# Patient Record
Sex: Male | Born: 1960 | Race: Black or African American | Hispanic: No | Marital: Married | Smoking: Never smoker
Health system: Southern US, Community
[De-identification: ages and names within clinical notes are randomized; demographics above are authoritative.]

## PROBLEM LIST (undated history)

## (undated) DIAGNOSIS — I10 Essential (primary) hypertension: Secondary | ICD-10-CM

## (undated) DIAGNOSIS — G4730 Sleep apnea, unspecified: Secondary | ICD-10-CM

## (undated) DIAGNOSIS — N400 Enlarged prostate without lower urinary tract symptoms: Secondary | ICD-10-CM

## (undated) DIAGNOSIS — M1990 Unspecified osteoarthritis, unspecified site: Secondary | ICD-10-CM

## (undated) HISTORY — PX: BACK SURGERY: SHX140

## (undated) HISTORY — PX: APPENDECTOMY: SHX54

---

## 1998-07-17 ENCOUNTER — Encounter: Admission: RE | Admit: 1998-07-17 | Discharge: 1998-07-19 | Payer: Self-pay | Admitting: *Deleted

## 2001-03-23 ENCOUNTER — Ambulatory Visit (HOSPITAL_BASED_OUTPATIENT_CLINIC_OR_DEPARTMENT_OTHER): Admission: RE | Admit: 2001-03-23 | Discharge: 2001-03-23 | Payer: Self-pay | Admitting: *Deleted

## 2002-11-22 ENCOUNTER — Ambulatory Visit (HOSPITAL_COMMUNITY): Admission: RE | Admit: 2002-11-22 | Discharge: 2002-11-22 | Payer: Self-pay | Admitting: Orthopedic Surgery

## 2002-11-22 ENCOUNTER — Encounter: Payer: Self-pay | Admitting: Orthopedic Surgery

## 2002-12-06 ENCOUNTER — Encounter (HOSPITAL_COMMUNITY): Admission: RE | Admit: 2002-12-06 | Discharge: 2003-01-05 | Payer: Self-pay | Admitting: Orthopedic Surgery

## 2006-01-21 ENCOUNTER — Encounter (HOSPITAL_COMMUNITY): Admission: RE | Admit: 2006-01-21 | Discharge: 2006-02-01 | Payer: Self-pay | Admitting: Pulmonary Disease

## 2006-02-03 ENCOUNTER — Encounter (HOSPITAL_COMMUNITY): Admission: RE | Admit: 2006-02-03 | Discharge: 2006-03-05 | Payer: Self-pay | Admitting: Pulmonary Disease

## 2006-03-09 ENCOUNTER — Encounter (HOSPITAL_COMMUNITY): Admission: RE | Admit: 2006-03-09 | Discharge: 2006-04-08 | Payer: Self-pay | Admitting: Pulmonary Disease

## 2010-02-21 ENCOUNTER — Ambulatory Visit: Admit: 2010-02-21 | Payer: Self-pay | Admitting: Urology

## 2010-02-28 ENCOUNTER — Ambulatory Visit
Admission: RE | Admit: 2010-02-28 | Discharge: 2010-02-28 | Payer: Self-pay | Source: Home / Self Care | Attending: Urology | Admitting: Urology

## 2010-03-07 ENCOUNTER — Ambulatory Visit (INDEPENDENT_AMBULATORY_CARE_PROVIDER_SITE_OTHER): Payer: BC Managed Care – PPO | Admitting: Urology

## 2010-03-07 DIAGNOSIS — N401 Enlarged prostate with lower urinary tract symptoms: Secondary | ICD-10-CM

## 2010-03-07 DIAGNOSIS — R39198 Other difficulties with micturition: Secondary | ICD-10-CM

## 2010-04-04 ENCOUNTER — Ambulatory Visit: Payer: BC Managed Care – PPO | Admitting: Urology

## 2011-01-16 ENCOUNTER — Encounter (HOSPITAL_COMMUNITY): Payer: Self-pay | Admitting: Dietician

## 2011-01-16 NOTE — Progress Notes (Signed)
Outpatient Nutrition Counseling Progress Note Date: 01/16/11 Time: 12:15 PM  Pt was a no-show for follow-up appointment scheduled for 01/16/11 at 11:00 AM. Sent letter to pt home notifying pt of no-show and requesting reschedule.  Melody Haver, RD, LDN Date: 01/16/11 Time: 12:15 PM

## 2011-02-19 ENCOUNTER — Encounter (HOSPITAL_COMMUNITY): Payer: Self-pay | Admitting: Dietician

## 2011-02-19 NOTE — Progress Notes (Signed)
Nutrition Follow-up Note  Pt presented to office as a walk-in for a weight check. Weight today was 283#. Weight from last visit on 09/15/10 was 282# (+1#, .4% weight gain x 5 months). Pt reports he has started eating fish. Noted pt was a no-show for last appointment; he reports that he will call at a later date for a follow-up appointment.   Melody Haver, RD, LDN Date: 02/19/11 Time: 2:20 PM

## 2016-01-13 ENCOUNTER — Ambulatory Visit (INDEPENDENT_AMBULATORY_CARE_PROVIDER_SITE_OTHER): Payer: Self-pay

## 2016-01-13 ENCOUNTER — Ambulatory Visit (INDEPENDENT_AMBULATORY_CARE_PROVIDER_SITE_OTHER): Payer: BC Managed Care – PPO | Admitting: Family

## 2016-01-13 DIAGNOSIS — G8929 Other chronic pain: Secondary | ICD-10-CM

## 2016-01-13 DIAGNOSIS — M25562 Pain in left knee: Secondary | ICD-10-CM | POA: Diagnosis not present

## 2016-01-13 NOTE — Progress Notes (Signed)
   Office Visit Note   Patient: Ryan Munoz           Date of Birth: 1960/02/19           MRN: [id number] Visit Date: 01/13/2016              Requested by: Kari BaarsEdward Hawkins, MD [address] [place], [place] [id number] PCP: Fredirick MaudlinHAWKINS,EDWARD L, MD   Assessment & Plan: Visit Diagnoses:  1. Chronic pain of left knee     Plan: Discussed the possibility of MRI to rule out meniscal injury. He would not like to proceed with this at this time. He will continue with home exercise program. NSAIDs and ice for pain. Will follow up in the office as needed.   Follow-Up Instructions: Return if symptoms worsen or fail to improve.   Orders:  Orders Placed This Encounter  Procedures  . XR Knee 1-2 Views Left   No orders of the defined types were placed in this encounter.     Procedures: No procedures performed   Clinical Data: No additional findings.   Subjective: Chief Complaint  Patient presents with  . Left Knee - Pain    No injury    Patient is a 55 year old gentleman seen for evaluation of left knee pain. Patient states that he has had pain for several years but that he had a flare up last week with swelling. This has since. He is having lateral side knee pain and states that it is catching. It feels like it needs to pop when this happens. Patient also complains of the pain being worse in the evening. He takes Advil occasionally which is moderately helpful.    Review of Systems  Constitutional: Negative for chills and fever.  Musculoskeletal: Positive for arthralgias. Negative for joint swelling.     Objective: Vital Signs: There were no vitals taken for this visit.  Physical Exam  Constitutional: He is oriented to person, place, and time. He appears well-developed and well-nourished.  Pulmonary/Chest: Effort normal.  Musculoskeletal:       Left knee: He exhibits no effusion.  Neurological: He is alert and oriented to person, place, and time.    Psychiatric: He has a normal mood and affect.  Nursing note reviewed.   Left Knee Exam   Tenderness  The patient is experiencing no tenderness.     Range of Motion  The patient has normal left knee ROM.  Tests  Drawer:       Anterior - negative      Varus: negative Valgus: negative  Other  Erythema: absent Swelling: mild Effusion: no effusion present      Specialty Comments:  No specialty comments available.  Imaging: No results found.   PMFS History: There are no active problems to display for this patient.  No past medical history on file.  No family history on file.  No past surgical history on file. Social History   Occupational History  . Not on file.   Social History Main Topics  . Smoking status: Not on file  . Smokeless tobacco: Not on file  . Alcohol use Not on file  . Drug use: Unknown  . Sexual activity: Not on file

## 2016-06-21 ENCOUNTER — Emergency Department (HOSPITAL_COMMUNITY)
Admission: EM | Admit: 2016-06-21 | Discharge: 2016-06-21 | Disposition: A | Discharge status: Home / Self Care | Payer: BC Managed Care – PPO | Attending: Emergency Medicine | Admitting: Emergency Medicine

## 2016-06-21 ENCOUNTER — Encounter (HOSPITAL_COMMUNITY): Payer: Self-pay | Admitting: Emergency Medicine

## 2016-06-21 DIAGNOSIS — Y92410 Unspecified street and highway as the place of occurrence of the external cause: Secondary | ICD-10-CM | POA: Diagnosis not present

## 2016-06-21 DIAGNOSIS — S3992XA Unspecified injury of lower back, initial encounter: Secondary | ICD-10-CM | POA: Diagnosis present

## 2016-06-21 DIAGNOSIS — M542 Cervicalgia: Secondary | ICD-10-CM | POA: Diagnosis not present

## 2016-06-21 DIAGNOSIS — Y999 Unspecified external cause status: Secondary | ICD-10-CM | POA: Insufficient documentation

## 2016-06-21 DIAGNOSIS — I10 Essential (primary) hypertension: Secondary | ICD-10-CM | POA: Diagnosis not present

## 2016-06-21 DIAGNOSIS — M545 Low back pain, unspecified: Secondary | ICD-10-CM

## 2016-06-21 DIAGNOSIS — Y9389 Activity, other specified: Secondary | ICD-10-CM | POA: Insufficient documentation

## 2016-06-21 HISTORY — DX: Sleep apnea, unspecified: G47.30

## 2016-06-21 HISTORY — DX: Essential (primary) hypertension: I10

## 2016-06-21 HISTORY — DX: Benign prostatic hyperplasia without lower urinary tract symptoms: N40.0

## 2016-06-21 NOTE — Discharge Instructions (Signed)
Alternate ibuprofen and Tylenol every 3 hours for the next few days for pain. Ice to areas of soreness for the next few days and then may move to heat. Do some gentle stretching in the shower to help relax muscles. Expect to be sore for the next few day and follow up with primary care physician for recheck of ongoing symptoms but return to ER for emergent changing or worsening of symptoms.   Use Moist heat therapy by taking a dish rag and soaking in water then ringing out excess water then microwave for 10-15 seconds until hot but not so hot that it would burn and heat to areas of soreness for 15 minutes, multiple times a day.
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
## 2016-07-20 ENCOUNTER — Ambulatory Visit (INDEPENDENT_AMBULATORY_CARE_PROVIDER_SITE_OTHER): Payer: Self-pay

## 2016-07-20 ENCOUNTER — Ambulatory Visit (INDEPENDENT_AMBULATORY_CARE_PROVIDER_SITE_OTHER): Payer: BC Managed Care – PPO | Admitting: Orthopedic Surgery

## 2016-07-20 ENCOUNTER — Encounter (INDEPENDENT_AMBULATORY_CARE_PROVIDER_SITE_OTHER): Payer: Self-pay | Admitting: Orthopedic Surgery

## 2016-07-20 VITALS — Ht 74.0 in | Wt 280.0 lb

## 2016-07-20 DIAGNOSIS — G8929 Other chronic pain: Secondary | ICD-10-CM

## 2016-07-20 DIAGNOSIS — M545 Low back pain, unspecified: Secondary | ICD-10-CM
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
## 2017-11-08 ENCOUNTER — Other Ambulatory Visit (HOSPITAL_BASED_OUTPATIENT_CLINIC_OR_DEPARTMENT_OTHER): Payer: Self-pay

## 2017-11-08 DIAGNOSIS — G4730 Sleep apnea, unspecified: Secondary | ICD-10-CM

## 2017-11-11 ENCOUNTER — Ambulatory Visit: Payer: BC Managed Care – PPO | Attending: Pulmonary Disease | Admitting: Neurology

## 2017-11-11 DIAGNOSIS — G4730 Sleep apnea, unspecified: Secondary | ICD-10-CM

## 2017-11-11 DIAGNOSIS — G4733 Obstructive sleep apnea (adult) (pediatric): Secondary | ICD-10-CM | POA: Diagnosis present

## 2017-11-17 NOTE — Procedures (Signed)
  [facility] Rashawd Laskaris A. Gerilyn Pilgrim, MD     [email or web address]             HOME SLEEP STUDY  LOCATION: [facility]  Patient Name: Ryan Munoz, Ryan Munoz Date: 11/11/2017 Gender: Male D.O.B: August 30, 1960 Age (years): 56 Referring Provider: Kari Baars Height (inches): 74 Interpreting Physician: Beryle Beams MD, ABSM Weight (lbs): 277 RPSGT: Peak, Robert BMI: 36 MRN: [id number] Neck Size: CLINICAL INFORMATION Sleep Study Type: HST     Indication for sleep study: N/A     Epworth Sleepiness Score: NA  SLEEP STUDY TECHNIQUE A multi-channel overnight portable sleep study was performed. The channels recorded were: nasal airflow, thoracic respiratory movement, and oxygen saturation with a pulse oximetry. Snoring was also monitored.  MEDICATIONS Patient self administered medications include: N/A. No current outpatient medications on file.   SLEEP ARCHITECTURE Patient was studied for 430.5 minutes. The sleep efficiency was 92.2 % and the patient was supine for 96.4%. The arousal index was 0.0 per hour.  RESPIRATORY PARAMETERS The overall AHI was 15.7 per hour, with a central apnea index of 0.6 per hour.  The oxygen nadir was 81% during sleep.     CARDIAC DATA Mean heart rate during sleep was 54.0 bpm.  IMPRESSIONS 1. Moderate obstructive sleep apnea occurred during this study (AHI = 15.7/h). A formal CPAP titration recording is recommended.    Argie Ramming, MD Diplomate, American Board of Sleep Medicine.   ELECTRONICALLY SIGNED ON:  11/17/2017, 10:04 AM [facility] PH: [phone]   FX: [phone] ACCREDITED BY THE AMERICAN ACADEMY OF SLEEP MEDICINE

## 2018-03-11 ENCOUNTER — Ambulatory Visit: Payer: BC Managed Care – PPO | Admitting: Urology

## 2018-03-11 DIAGNOSIS — N401 Enlarged prostate with lower urinary tract symptoms: Secondary | ICD-10-CM

## 2018-03-11 DIAGNOSIS — R3914 Feeling of incomplete bladder emptying: Secondary | ICD-10-CM | POA: Diagnosis not present

## 2018-03-11 DIAGNOSIS — R3912 Poor urinary stream: Secondary | ICD-10-CM | POA: Diagnosis not present

## 2018-03-18 ENCOUNTER — Other Ambulatory Visit: Payer: Self-pay | Admitting: Urology

## 2018-03-31 NOTE — Patient Instructions (Signed)
Ryan Munoz  03/31/2018     @PREFPERIOPPHARMACY @   Your procedure is scheduled on  04/07/2018  Report to [facility] at  615   A.M.  Call this number if you have problems the morning of surgery:  [phone]   Remember:  Do not eat or drink after midnight.                        Take these medicines the morning of surgery with A SIP OF WATER  Amlodipine, claritin.    Do not wear jewelry, make-up or nail polish.  Do not wear lotions, powders, or perfumes, or deodorant.  Do not shave 48 hours prior to surgery.  Men may shave face and neck.  Do not bring valuables to the hospital.  [facility] is not responsible for any belongings or valuables.  Contacts, dentures or bridgework may not be worn into surgery.  Leave your suitcase in the car.  After surgery it may be brought to your room.  For patients admitted to the hospital, discharge time will be determined by your treatment team.  Patients discharged the day of surgery will not be allowed to drive home.   Name and phone number of your driver:   family Special instructions:  None  Please read over the following fact sheets that you were given. Anesthesia Post-op Instructions and Care and Recovery After Surgery       Cystoscopy  Cystoscopy is a procedure that is used to help diagnose and sometimes treat conditions that affect that lower urinary tract. The lower urinary tract includes the bladder and the tube that drains urine from the bladder out of the body (urethra). Cystoscopy is performed with a thin, tube-shaped instrument with a light and camera at the end (cystoscope). The cystoscope may be hard (rigid) or flexible, depending on the goal of the procedure.The cystoscope is inserted through the urethra, into the bladder. Cystoscopy may be recommended if you have:  Urinary tractinfections that keep coming back (recurring).  Blood in the urine (hematuria).  Loss of bladder control (urinary incontinence)  or an overactive bladder.  Unusual cells found in a urine sample.  A blockage in the urethra.  Painful urination.  An abnormality in the bladder found during an intravenous pyelogram (IVP) or CT scan. Cystoscopy may also be done to remove a sample of tissue to be examined under a microscope (biopsy). Tell a health care provider about:  Any allergies you have.  All medicines you are taking, including vitamins, herbs, eye drops, creams, and over-the-counter medicines.  Any problems you or family members have had with anesthetic medicines.  Any blood disorders you have.  Any surgeries you have had.  Any medical conditions you have.  Whether you are pregnant or may be pregnant. What are the risks? Generally, this is a safe procedure. However, problems may occur, including:  Infection.  Bleeding.  Allergic reactions to medicines.  Damage to other structures or organs. What happens before the procedure?  Ask your health care provider about: ? Changing or stopping your regular medicines. This is especially important if you are taking diabetes medicines or blood thinners. ? Taking medicines such as aspirin and ibuprofen. These medicines can thin your blood. Do not take these medicines before your procedure if your health care provider instructs you not to.  Follow instructions from your health care provider about eating or drinking restrictions.  You may be given  antibiotic medicine to help prevent infection.  You may have an exam or testing, such as X-rays of the bladder, urethra, or kidneys.  You may have urine tests to check for signs of infection.  Plan to have someone take you home after the procedure. What happens during the procedure?  To reduce your risk of infection,your health care team will wash or sanitize their hands.  You will be given one or more of the following: ? A medicine to help you relax (sedative). ? A medicine to numb the area (local  anesthetic).  The area around the opening of your urethra will be cleaned.  The cystoscope will be passed through your urethra into your bladder.  Germ-free (sterile)fluid will flow through the cystoscope to fill your bladder. The fluid will stretch your bladder so that your surgeon can clearly examine your bladder walls.  The cystoscope will be removed and your bladder will be emptied. The procedure may vary among health care providers and hospitals. What happens after the procedure?  You may have some soreness or pain in your abdomen and urethra. Medicines will be available to help you.  You may have some blood in your urine.  Do not drive for 24 hours if you received a sedative. This information is not intended to replace advice given to you by your health care provider. Make sure you discuss any questions you have with your health care provider. Document Released: 01/17/2000 Document Revised: 10/30/2016 Document Reviewed: 12/06/2014 Elsevier Interactive Patient Education  2019 [employer] General Anesthesia, Adult, Care After This sheet gives you information about how to care for yourself after your procedure. Your health care provider may also give you more specific instructions. If you have problems or questions, contact your health care provider. What can I expect after the procedure? After the procedure, the following side effects are common:  Pain or discomfort at the IV site.  Nausea.  Vomiting.  Sore throat.  Trouble concentrating.  Feeling cold or chills.  Weak or tired.  Sleepiness and fatigue.  Soreness and body aches. These side effects can affect parts of the body that were not involved in surgery. Follow these instructions at home:  For at least 24 hours after the procedure:  Have a responsible adult stay with you. It is important to have someone help care for you until you are awake and alert.  Rest as needed.  Do not: ? Participate in  activities in which you could fall or become injured. ? Drive. ? Use heavy machinery. ? Drink alcohol. ? Take sleeping pills or medicines that cause drowsiness. ? Make important decisions or sign legal documents. ? Take care of children on your own. Eating and drinking  Follow any instructions from your health care provider about eating or drinking restrictions.  When you feel hungry, start by eating small amounts of foods that are soft and easy to digest (bland), such as toast. Gradually return to your regular diet.  Drink enough fluid to keep your urine pale yellow.  If you vomit, rehydrate by drinking water, juice, or clear broth. General instructions  If you have sleep apnea, surgery and certain medicines can increase your risk for breathing problems. Follow instructions from your health care provider about wearing your sleep device: ? Anytime you are sleeping, including during daytime naps. ? While taking prescription pain medicines, sleeping medicines, or medicines that make you drowsy.  Return to your normal activities as told by your health care provider. Ask your health  care provider what activities are safe for you.  Take over-the-counter and prescription medicines only as told by your health care provider.  If you smoke, do not smoke without supervision.  Keep all follow-up visits as told by your health care provider. This is important. Contact a health care provider if:  You have nausea or vomiting that does not get better with medicine.  You cannot eat or drink without vomiting.  You have pain that does not get better with medicine.  You are unable to pass urine.  You develop a skin rash.  You have a fever.  You have redness around your IV site that gets worse. Get help right away if:  You have difficulty breathing.  You have chest pain.  You have blood in your urine or stool, or you vomit blood. Summary  After the procedure, it is common to have a  sore throat or nausea. It is also common to feel tired.  Have a responsible adult stay with you for the first 24 hours after general anesthesia. It is important to have someone help care for you until you are awake and alert.  When you feel hungry, start by eating small amounts of foods that are soft and easy to digest (bland), such as toast. Gradually return to your regular diet.  Drink enough fluid to keep your urine pale yellow.  Return to your normal activities as told by your health care provider. Ask your health care provider what activities are safe for you. This information is not intended to replace advice given to you by your health care provider. Make sure you discuss any questions you have with your health care provider. Document Released: 04/27/2000 Document Revised: 09/04/2016 Document Reviewed: 09/04/2016 Elsevier Interactive Patient Education  2019 [employer].

## 2018-04-01 ENCOUNTER — Other Ambulatory Visit: Payer: Self-pay

## 2018-04-01 ENCOUNTER — Encounter (HOSPITAL_COMMUNITY)
Admission: RE | Admit: 2018-04-01 | Discharge: 2018-04-01 | Disposition: A | Discharge status: Home / Self Care | Payer: BC Managed Care – PPO | Source: Ambulatory Visit | Attending: Urology | Admitting: Urology

## 2018-04-01 ENCOUNTER — Encounter (HOSPITAL_COMMUNITY): Payer: Self-pay

## 2018-04-01 DIAGNOSIS — Z01818 Encounter for other preprocedural examination: Secondary | ICD-10-CM | POA: Insufficient documentation

## 2018-04-01 HISTORY — DX: Unspecified osteoarthritis, unspecified site: M19.90

## 2018-04-06 NOTE — H&P (Signed)
H&P  Chief Complaint: Urinary difficulty/large prostate  History of Present Illness: 58 year old male, patient of Dr. [name], presents at this time for UroLift procedure.  He has BPH with significant symptomatology.  Dr. Annabell Howells has evaluated him, he has a prostate under 50 mL in size.  He has been counseled and management of obstructive symptoms of BPH with TURP versus uro-lift.  Risks and complications have been discussed and he desires to proceed with UroLift.  Past Medical History:  Diagnosis Date  . Arthritis    degenerative disc disease  . Enlarged prostate   . Hypertension   . Sleep apnea     Past Surgical History:  Procedure Laterality Date  . APPENDECTOMY    . BACK SURGERY      Home Medications:  Allergies as of 04/06/2018   No Known Allergies     Medication List    Notice   Cannot display discharge medications because the patient has not yet been admitted.     Allergies: No Known Allergies  Family History  Problem Relation Age of Onset  . Cancer Mother   . Cancer Sister   . Diabetes Father   . Hypertension Father     Social History:  reports that he has never smoked. He has never used smokeless tobacco. He reports that he does not drink alcohol or use drugs.  ROS: A complete review of systems was performed.  All systems are negative except for pertinent findings as noted.  Physical Exam:  Vital signs in last 24 hours:   Constitutional:  Alert and oriented, No acute distress Cardiovascular: Regular rate  Respiratory: Normal respiratory effort GI: Abdomen is soft, nontender, nondistended, no abdominal masses. No CVAT.  Genitourinary: Normal male phallus, testes are descended bilaterally and non-tender and without masses, scrotum is normal in appearance without lesions or masses, perineum is normal on inspection. Lymphatic: No lymphadenopathy Neurologic: Grossly intact, no focal deficits Psychiatric: Normal mood and affect  Laboratory Data:  No  results for input(s): WBC, HGB, HCT, PLT in the last 72 hours.  No results for input(s): NA, K, CL, GLUCOSE, BUN, CALCIUM, CREATININE in the last 72 hours.  Invalid input(s): CO3   No results found for this or any previous visit (from the past 24 hour(s)). No results found for this or any previous visit (from the past 240 hour(s)).  Renal Function: No results for input(s): CREATININE in the last 168 hours. CrCl cannot be calculated (No successful lab value found.).  Radiologic Imaging: No results found.  Impression/Assessment:  BPH with obstructive symptoms  Plan:  UroLift procedure

## 2018-04-07 ENCOUNTER — Encounter (HOSPITAL_COMMUNITY)
Admission: RE | Disposition: A | Discharge status: Home / Self Care | Payer: Self-pay | Source: Home / Self Care | Attending: Urology

## 2018-04-07 ENCOUNTER — Ambulatory Visit (HOSPITAL_COMMUNITY): Payer: BC Managed Care – PPO | Admitting: Anesthesiology

## 2018-04-07 ENCOUNTER — Encounter (HOSPITAL_COMMUNITY): Payer: Self-pay | Admitting: *Deleted

## 2018-04-07 ENCOUNTER — Ambulatory Visit (HOSPITAL_COMMUNITY)
Admission: RE | Admit: 2018-04-07 | Discharge: 2018-04-07 | Disposition: A | Discharge status: Home / Self Care | Payer: BC Managed Care – PPO | Attending: Urology | Admitting: Urology

## 2018-04-07 DIAGNOSIS — M479 Spondylosis, unspecified: Secondary | ICD-10-CM | POA: Insufficient documentation

## 2018-04-07 DIAGNOSIS — Z8249 Family history of ischemic heart disease and other diseases of the circulatory system: Secondary | ICD-10-CM | POA: Insufficient documentation

## 2018-04-07 DIAGNOSIS — I10 Essential (primary) hypertension: Secondary | ICD-10-CM | POA: Insufficient documentation

## 2018-04-07 DIAGNOSIS — M1990 Unspecified osteoarthritis, unspecified site: Secondary | ICD-10-CM | POA: Insufficient documentation

## 2018-04-07 DIAGNOSIS — G4730 Sleep apnea, unspecified: Secondary | ICD-10-CM | POA: Insufficient documentation

## 2018-04-07 DIAGNOSIS — N138 Other obstructive and reflux uropathy: Secondary | ICD-10-CM | POA: Diagnosis not present

## 2018-04-07 DIAGNOSIS — N401 Enlarged prostate with lower urinary tract symptoms: Secondary | ICD-10-CM | POA: Insufficient documentation

## 2018-04-07 HISTORY — PX: CYSTOSCOPY WITH INSERTION OF UROLIFT: SHX6678

## 2018-04-07 SURGERY — CYSTOSCOPY WITH INSERTION OF UROLIFT
Anesthesia: General

## 2018-04-07 MED ORDER — PROPOFOL 10 MG/ML IV BOLUS
INTRAVENOUS | Status: AC
  Filled 2018-04-07: qty 40

## 2018-04-07 MED ORDER — PROPOFOL 10 MG/ML IV BOLUS
INTRAVENOUS | Status: DC | PRN
  Administered 2018-04-07: 40 mg via INTRAVENOUS
  Administered 2018-04-07: 200 mg via INTRAVENOUS

## 2018-04-07 MED ORDER — MIDAZOLAM HCL 2 MG/2ML IJ SOLN: 0.5000 mg | Freq: Once | INTRAMUSCULAR | Status: DC | PRN

## 2018-04-07 MED ORDER — MIDAZOLAM HCL 5 MG/5ML IJ SOLN
INTRAMUSCULAR | Status: DC | PRN
  Administered 2018-04-07: 2 mg via INTRAVENOUS

## 2018-04-07 MED ORDER — STERILE WATER FOR IRRIGATION IR SOLN
Status: DC | PRN
  Administered 2018-04-07: 500 mL
  Administered 2018-04-07 (×2): 3000 mL

## 2018-04-07 MED ORDER — LIDOCAINE HCL URETHRAL/MUCOSAL 2 % EX GEL
CUTANEOUS | Status: AC
  Filled 2018-04-07: qty 10

## 2018-04-07 MED ORDER — MIDAZOLAM HCL 2 MG/2ML IJ SOLN
INTRAMUSCULAR | Status: AC
  Filled 2018-04-07: qty 2

## 2018-04-07 MED ORDER — FENTANYL CITRATE (PF) 100 MCG/2ML IJ SOLN
INTRAMUSCULAR | Status: AC
  Filled 2018-04-07: qty 2

## 2018-04-07 MED ORDER — CEPHALEXIN 500 MG PO CAPS: 500.0000 mg | ORAL_CAPSULE | Freq: Two times a day (BID) | ORAL | 0 refills | Status: DC

## 2018-04-07 MED ORDER — PROMETHAZINE HCL 25 MG/ML IJ SOLN: 6.2500 mg | INTRAMUSCULAR | Status: DC | PRN

## 2018-04-07 MED ORDER — LIDOCAINE HCL 1 % IJ SOLN
INTRAMUSCULAR | Status: DC | PRN
  Administered 2018-04-07: 60 mg via INTRADERMAL

## 2018-04-07 MED ORDER — CEFAZOLIN SODIUM-DEXTROSE 2-4 GM/100ML-% IV SOLN
2.0000 g | INTRAVENOUS | Status: AC
  Administered 2018-04-07: 2 g via INTRAVENOUS

## 2018-04-07 MED ORDER — CEFAZOLIN SODIUM-DEXTROSE 2-4 GM/100ML-% IV SOLN
INTRAVENOUS | Status: AC
  Filled 2018-04-07: qty 100

## 2018-04-07 MED ORDER — HYDROMORPHONE HCL 1 MG/ML IJ SOLN: 0.2500 mg | INTRAMUSCULAR | Status: DC | PRN

## 2018-04-07 MED ORDER — LACTATED RINGERS IV SOLN
INTRAVENOUS | Status: DC
  Administered 2018-04-07: 07:00:00 via INTRAVENOUS

## 2018-04-07 MED ORDER — FENTANYL CITRATE (PF) 100 MCG/2ML IJ SOLN
INTRAMUSCULAR | Status: DC | PRN
  Administered 2018-04-07 (×2): 50 ug via INTRAVENOUS

## 2018-04-07 MED ORDER — HYDROCODONE-ACETAMINOPHEN 7.5-325 MG PO TABS: 1.0000 | ORAL_TABLET | Freq: Once | ORAL | Status: DC | PRN

## 2018-04-07 MED ORDER — LIDOCAINE HCL URETHRAL/MUCOSAL 2 % EX GEL
CUTANEOUS | Status: DC | PRN
  Administered 2018-04-07: 1 via URETHRAL

## 2018-04-07 SURGICAL SUPPLY — 19 items
BAG DRAIN URO TABLE W/ADPT NS (BAG) ×3 IMPLANT
BAG URINE DRAINAGE (UROLOGICAL SUPPLIES) ×3 IMPLANT
CATH FOLEY 2WAY SLVR  5CC 18FR (CATHETERS) ×2
CATH FOLEY 2WAY SLVR 5CC 18FR (CATHETERS) ×1 IMPLANT
CLOTH BEACON ORANGE TIMEOUT ST (SAFETY) ×3 IMPLANT
GLOVE BIO SURGEON STRL SZ8 (GLOVE) ×3 IMPLANT
GLOVE BIOGEL PI IND STRL 7.0 (GLOVE) ×2 IMPLANT
GLOVE BIOGEL PI INDICATOR 7.0 (GLOVE) ×4
GLOVE ECLIPSE 6.5 STRL STRAW (GLOVE) ×3 IMPLANT
GOWN STRL REUS W/TWL LRG LVL3 (GOWN DISPOSABLE) ×3 IMPLANT
GOWN STRL REUS W/TWL XL LVL3 (GOWN DISPOSABLE) ×3 IMPLANT
KIT TURNOVER CYSTO (KITS) ×3 IMPLANT
MANIFOLD NEPTUNE II (INSTRUMENTS) ×3 IMPLANT
PACK CYSTO (CUSTOM PROCEDURE TRAY) ×3 IMPLANT
PAD ARMBOARD 7.5X6 YLW CONV (MISCELLANEOUS) ×3 IMPLANT
SYSTEM UROLIFT (Male Continence) ×15 IMPLANT
TOWEL OR 17X26 4PK STRL BLUE (TOWEL DISPOSABLE) ×3 IMPLANT
WATER STERILE IRR 3000ML UROMA (IV SOLUTION) ×6 IMPLANT
WATER STERILE IRR 500ML POUR (IV SOLUTION) ×3 IMPLANT

## 2018-04-07 NOTE — Interval H&P Note (Signed)
History and Physical Interval Note:  04/07/2018 7:23 AM  Ryan Munoz  has presented today for surgery, with the diagnosis of BENIGN PROSTATE HYPERPLASIA WITH BLADDER OUTLET OBSTRUCTION  The various methods of treatment have been discussed with the patient and family. After consideration of risks, benefits and other options for treatment, the patient has consented to  Procedure(s): CYSTOSCOPY WITH INSERTION OF UROLIFT (N/A) as a surgical intervention .  The patient's history has been reviewed, patient examined, no change in status, stable for surgery.  I have reviewed the patient's chart and labs.  Questions were answered to the patient's satisfaction.     Bertram Millard Geneve Kimpel

## 2018-04-07 NOTE — Anesthesia Procedure Notes (Signed)
Procedure Name: LMA Insertion Date/Time: 04/07/2018 7:39 AM Performed by: Despina Hidden, CRNA Pre-anesthesia Checklist: Patient identified, Patient being monitored, Emergency Drugs available, Timeout performed and Suction available Patient Re-evaluated:Patient Re-evaluated prior to induction Oxygen Delivery Method: Circle System Utilized Preoxygenation: Pre-oxygenation with 100% oxygen Induction Type: IV induction Ventilation: Mask ventilation without difficulty LMA: LMA inserted LMA Size: 5.0 Number of attempts: 1 Placement Confirmation: positive ETCO2 and breath sounds checked- equal and bilateral Tube secured with: Tape Dental Injury: Teeth and Oropharynx as per pre-operative assessment

## 2018-04-07 NOTE — Transfer of Care (Signed)
Immediate Anesthesia Transfer of Care Note  Patient: Ryan Munoz  Procedure(s) Performed: CYSTOSCOPY WITH INSERTION OF UROLIFT (N/A )  Patient Location: PACU  Anesthesia Type:General  Level of Consciousness: drowsy and patient cooperative  Airway & Oxygen Therapy: Patient Spontanous Breathing  Post-op Assessment: Report given to RN, Post -op Vital signs reviewed and stable and Patient moving all extremities  Post vital signs: Reviewed and stable  Last Vitals:  Vitals Value Taken Time  BP    Temp    Pulse 61 04/07/2018  8:19 AM  Resp    SpO2 95 % 04/07/2018  8:19 AM  Vitals shown include unvalidated device data.  Last Pain:  Vitals:   04/07/18 0653  TempSrc: Oral  PainSc: 0-No pain      Patients Stated Pain Goal: 7 (04/07/18 0223)  Complications: No apparent anesthesia complications

## 2018-04-07 NOTE — Discharge Instructions (Signed)
1.  Expect to see some bloody urethral drainage as well as blood in the initial part of your urinary stream for a few days  2.  If you are on a medicine for your prostate i.e. tamsulosin, alfuzosin, doxazosin or terazosin, it is okay to stop that 2-3 days after your procedure  3.  If you are on finasteride, it is okay to stop that immediately  4.  Limit your exertional activity for approximately 2 weeks after the procedure.  If you are having no bloody drainage or pain at that point, you may liberalize your activities  5.  Keep your follow-up appointment that is scheduled.  If you have problems before the appointment such as continued large clots in your urine, difficulty urinating or fever, contact us before at [phone]  6. If urine relatively free of clots it is OK to remove the catheter as instructed on Friday morning        Indwelling Urinary Catheter Care, Adult An indwelling urinary catheter is a thin tube that is put into your bladder. The tube helps to drain pee (urine) out of your body. The tube goes in through your urethra. Your urethra is where pee comes out of your body. Your pee will come out through the catheter, then it will go into a bag (drainage bag). Take good care of your catheter so it will work well. How to wear your catheter and bag Supplies needed  Sticky tape (adhesive tape) or a leg strap.  Alcohol wipe or soap and water (if you use tape).  A clean towel (if you use tape).  Large overnight bag.  Smaller bag (leg bag). Wearing your catheter Attach your catheter to your leg with tape or a leg strap.  Make sure the catheter is not pulled tight.  If a leg strap gets wet, take it off and put on a dry strap.  If you use tape to hold the bag on your leg: 1. Use an alcohol wipe or soap and water to wash your skin where the tape made it sticky before. 2. Use a clean towel to pat-dry that skin. 3. Use new tape to make the bag stay on your leg. Wearing  your bags You should have been given a large overnight bag.  You may wear the overnight bag in the day or night.  Always have the overnight bag lower than your bladder.  Do not let the bag touch the floor.  Before you go to sleep, put a clean plastic bag in a wastebasket. Then hang the overnight bag inside the wastebasket. You should also have a smaller leg bag that fits under your clothes.  Always wear the leg bag below your knee.  Do not wear your leg bag at night. How to care for your skin and catheter Supplies needed  A clean washcloth.  Water and mild soap.  A clean towel. Caring for your skin and catheter      Clean the skin around your catheter every day: ? Wash your hands with soap and water. ? Wet a clean washcloth in warm water and mild soap. ? Clean the skin around your urethra. ? If you are male: ? Gently spread the folds of skin around your vagina (labia). ? With the washcloth in your other hand, wipe the inner side of your labia on each side. Wipe from front to back. ? If you are male: ? Pull back any skin that covers the end of your penis (foreskin). ?  With the washcloth in your other hand, wipe your penis in small circles. Start wiping at the tip of your penis, then move away from the catheter. ? With your free hand, hold the catheter close to where it goes into your body. ? Keep holding the catheter during cleaning so it does not get pulled out. ? With the washcloth in your other hand, clean the catheter. ? Only wipe downward on the catheter. ? Do not wipe upward toward your body. Doing this may push germs into your urethra and cause infection. ? Use a clean towel to pat-dry the catheter and the skin around it. Make sure to wipe off all soap. ? Wash your hands with soap and water.  Shower every day. Do not take baths.  Do not use cream, ointment, or lotion on the area where the catheter goes into your body, unless your doctor tells you to.  Do not  use powders, sprays, or lotions on your genital area.  Check your skin around the catheter every day for signs of infection. Check for: ? Redness, swelling, or pain. ? Fluid or blood. ? Warmth. ? Pus or a bad smell. How to empty the bag Supplies needed  Rubbing alcohol.  Gauze pad or cotton ball.  Tape or a leg strap. Emptying the bag Pour the pee out of your bag when it is ?- full, or at least 2-3 times a day. Do this for your overnight bag and your leg bag. 1. Wash your hands with soap and water. 2. Separate (detach) the bag from your leg. 3. Hold the bag over the toilet or a clean pail. Keep the bag lower than your hips and bladder. This is so the pee (urine) does not go back into the tube. 4. Open the pour spout. It is at the bottom of the bag. 5. Empty the pee into the toilet or pail. Do not let the pour spout touch any surface. 6. Put rubbing alcohol on a gauze pad or cotton ball. 7. Use the gauze pad or cotton ball to clean the pour spout. 8. Close the pour spout. 9. Attach the bag to your leg with tape or a leg strap. 10. Wash your hands with soap and water. Follow instructions for cleaning the drainage bag:  From the product maker.  As told by your doctor. How to change the bag Supplies needed  Alcohol wipes.  A clean bag.  Tape or a leg strap. Changing the bag Replace your bag with a clean bag once a month. If it starts to leak, smell bad, or look dirty, change it sooner. 1. Wash your hands with soap and water. 2. Separate the dirty bag from your leg. 3. Pinch the catheter with your fingers so that pee does not spill out. 4. Separate the catheter tube from the bag tube where these tubes connect (at the connection valve). Do not let the tubes touch any surface. 5. Clean the end of the catheter tube with an alcohol wipe. Use a different alcohol wipe to clean the end of the bag tube. 6. Connect the catheter tube to the tube of the clean bag. 7. Attach the clean  bag to your leg with tape or a leg strap. Do not make the bag tight on your leg. 8. Wash your hands with soap and water. General rules   Never pull on your catheter. Never try to take it out. Doing that can hurt you.  Always wash your hands before and after  you touch your catheter or bag. Use a mild, fragrance-free soap. If you do not have soap and water, use hand sanitizer.  Always make sure there are no twists or bends (kinks) in the catheter tube.  Always make sure there are no leaks in the catheter or bag.  Drink enough fluid to keep your pee pale yellow.  Do not take baths, swim, or use a hot tub.  If you are male, wipe from front to back after you poop (have a bowel movement). Contact a doctor if:  Your pee is cloudy.  Your pee smells worse than usual.  Your catheter gets clogged.  Your catheter leaks.  Your bladder feels full. Get help right away if:  You have redness, swelling, or pain where the catheter goes into your body.  You have fluid, blood, pus, or a bad smell coming from the area where the catheter goes into your body.  Your skin feels warm where the catheter goes into your body.  You have a fever.  You have pain in your: ? Belly (abdomen). ? Legs. ? Lower back. ? Bladder.  You see blood in the catheter.  Your pee is pink or red.  You feel sick to your stomach (nauseous).  You throw up (vomit).  You have chills.  Your pee is not draining into the bag.  Your catheter gets pulled out. Summary  An indwelling urinary catheter is a thin tube that is placed into the bladder to help drain pee (urine) out of the body.  The catheter is placed into the part of the body that drains pee from the bladder (urethra).  Taking good care of your catheter will keep it working properly and help prevent problems.  Always wash your hands before and after touching your catheter or bag.  Never pull on your catheter or try to take it out. This  information is not intended to replace advice given to you by your health care provider. Make sure you discuss any questions you have with your health care provider. Document Released: 05/16/2012 Document Revised: 07/12/2017 Document Reviewed: 09/04/2016 Elsevier Interactive Patient Education  2019 [employer]      General Anesthesia, Adult, Care After This sheet gives you information about how to care for yourself after your procedure. Your health care provider may also give you more specific instructions. If you have problems or questions, contact your health care provider. What can I expect after the procedure? After the procedure, the following side effects are common:  Pain or discomfort at the IV site.  Nausea.  Vomiting.  Sore throat.  Trouble concentrating.  Feeling cold or chills.  Weak or tired.  Sleepiness and fatigue.  Soreness and body aches. These side effects can affect parts of the body that were not involved in surgery. Follow these instructions at home:  For at least 24 hours after the procedure:  Have a responsible adult stay with you. It is important to have someone help care for you until you are awake and alert.  Rest as needed.  Do not: ? Participate in activities in which you could fall or become injured. ? Drive. ? Use heavy machinery. ? Drink alcohol. ? Take sleeping pills or medicines that cause drowsiness. ? Make important decisions or sign legal documents. ? Take care of children on your own. Eating and drinking  Follow any instructions from your health care provider about eating or drinking restrictions.  When you feel hungry, start by eating small amounts of  foods that are soft and easy to digest (bland), such as toast. Gradually return to your regular diet.  Drink enough fluid to keep your urine pale yellow.  If you vomit, rehydrate by drinking water, juice, or clear broth. General instructions  If you have sleep apnea,  surgery and certain medicines can increase your risk for breathing problems. Follow instructions from your health care provider about wearing your sleep device: ? Anytime you are sleeping, including during daytime naps. ? While taking prescription pain medicines, sleeping medicines, or medicines that make you drowsy.  Return to your normal activities as told by your health care provider. Ask your health care provider what activities are safe for you.  Take over-the-counter and prescription medicines only as told by your health care provider.  If you smoke, do not smoke without supervision.  Keep all follow-up visits as told by your health care provider. This is important. Contact a health care provider if:  You have nausea or vomiting that does not get better with medicine.  You cannot eat or drink without vomiting.  You have pain that does not get better with medicine.  You are unable to pass urine.  You develop a skin rash.  You have a fever.  You have redness around your IV site that gets worse. Get help right away if:  You have difficulty breathing.  You have chest pain.  You have blood in your urine or stool, or you vomit blood. Summary  After the procedure, it is common to have a sore throat or nausea. It is also common to feel tired.  Have a responsible adult stay with you for the first 24 hours after general anesthesia. It is important to have someone help care for you until you are awake and alert.  When you feel hungry, start by eating small amounts of foods that are soft and easy to digest (bland), such as toast. Gradually return to your regular diet.  Drink enough fluid to keep your urine pale yellow.  Return to your normal activities as told by your health care provider. Ask your health care provider what activities are safe for you. This information is not intended to replace advice given to you by your health care provider. Make sure you discuss any  questions you have with your health care provider. Document Released: 04/27/2000 Document Revised: 09/04/2016 Document Reviewed: 09/04/2016 Elsevier Interactive Patient Education  2019 [employer].

## 2018-04-07 NOTE — Anesthesia Postprocedure Evaluation (Signed)
Anesthesia Post Note  Patient: Ryan Munoz  Procedure(s) Performed: CYSTOSCOPY WITH INSERTION OF UROLIFT (N/A )  Patient location during evaluation: PACU Anesthesia Type: General Level of consciousness: awake and alert and oriented Pain management: pain level controlled Vital Signs Assessment: post-procedure vital signs reviewed and stable Respiratory status: spontaneous breathing Cardiovascular status: blood pressure returned to baseline and stable Postop Assessment: no apparent nausea or vomiting Anesthetic complications: no     Last Vitals:  Vitals:   04/07/18 0919 04/07/18 0945  BP:  (!) 151/97  Pulse:  62  Resp:  16  Temp: 36.5 C   SpO2:  98%    Last Pain:  Vitals:   04/07/18 0945  TempSrc:   PainSc: 6                  Amiee Wiley

## 2018-04-07 NOTE — Op Note (Signed)
Preoperative diagnosis: BPH with obstructive symptomatology.  Postoperative diagnosis: Same  Principal procedure: Urolift procedure, with the placement of 6 implants.  Surgeon: Retta Diones  Anesthesia: Gen. with LMA  Complications: None  Drains: 18 French Foley catheter, to leg bag.  Estimated blood loss: Less than 25 mL  Indications: 58 -year-old male with obstructive symptomatology secondary to BPH.  The patient's symptoms have progressed, and he has requested further management.  Management options including TURP with resection/ablation of the prostate as well as Urolift were discussed.  The patient has chosen to have a Urolift procedure.  He has been instructed to the procedure as well as risks and complications which include but are not limited to infection, bleeding, and inadequate treatment with the Urolift procedure alone, anesthetic complications, among others.  He understands these and desires to proceed.  Findings: Using the 17 French cystoscope, urethra and bladder were inspected.  There were no urethral lesions.  Prostatic urethra was obstructed secondary to bilobar hypertrophy.  The bladder was inspected circumferentially.  This revealed normal findings.  Description of procedure: The patient was properly identified in the holding area.  He received preoperative IV antibiotics.  He was taken to the operating room where general anesthetic was administered with the LMA.  He is placed in the dorsolithotomy position.  Genitalia and perineum were prepped and draped.  Proper timeout was performed.  A 46F cystoscope was inserted into the bladder. The cystoscopy bridge was replaced with a UroLift delivery device.The first treatment site was the patient's right side approximately 1.5cm distal to the bladder neck. The distal tip of the delivery device was then angled laterally approximately 20 degrees at this position to compress the lateral lobe. The trigger was pulled, thereby deploying a  needle containing the implant through the prostate. The needle was then retracted, allowing one end of the implant to be delivered to the capsular surface of the prostate. The implant was then tensioned to assure capsular seating and removal of slack monofilament. The device was then angled back toward midline and slowly advanced proximally until cystoscopic verification of the monofilament being centered in the delivery bay. The urethral end piece was then affixed to the monofilament thereby tailoring the size of the implant. Excess filament was then severed. The delivery device was then re-advanced into the bladder. The delivery device was then replaced with cystoscope and bridge and the implant location and opening effect was confirmed cystoscopically. The same procedure was then repeated on the left side, and 2 additional implants were delivered just proximal to the verumontanum, again one on right and one on left side of the prostate, following the same technique. Additionally 2 more implants were delivered in the mid urethra directed anterolaterally. The left sided implant pulled through and was removed from the bladder.   A final cystoscopy was conducted first to inspect the location and state of each implant and second, to confirm the presence of a continuous anterior channel was present through the prostatic urethra with irrigation flow turned off. 6 Implants were delivered in total.  Following this, the scope was removed and a 18 French Foley catheter was placed and hooked to dependent drainage.  He was then awakened and taken to the PACU in stable condition.  He tolerated the procedure well.

## 2018-04-07 NOTE — Anesthesia Preprocedure Evaluation (Signed)
Anesthesia Evaluation  Patient identified by MRN, date of birth, ID band Patient awake    Reviewed: Allergy & Precautions, NPO status , Patient's Chart, lab work & pertinent test results  Airway Mallampati: III  TM Distance: >3 FB Neck ROM: Full    Dental no notable dental hx. (+) Teeth Intact   Pulmonary sleep apnea and Continuous Positive Airway Pressure Ventilation ,    Pulmonary exam normal breath sounds clear to auscultation       Cardiovascular Exercise Tolerance: Good hypertension, Pt. on medications negative cardio ROS Normal cardiovascular examI Rhythm:Regular Rate:Normal     Neuro/Psych negative neurological ROS  negative psych ROS   GI/Hepatic negative GI ROS, Neg liver ROS,   Endo/Other  negative endocrine ROS  Renal/GU negative Renal ROS  negative genitourinary   Musculoskeletal  (+) Arthritis , Osteoarthritis,    Abdominal   Peds negative pediatric ROS (+)  Hematology negative hematology ROS (+)   Anesthesia Other Findings   Reproductive/Obstetrics negative OB ROS                             Anesthesia Physical Anesthesia Plan  ASA: II  Anesthesia Plan: General   Post-op Pain Management:    Induction: Intravenous  PONV Risk Score and Plan:   Airway Management Planned: LMA  Additional Equipment:   Intra-op Plan:   Post-operative Plan: Extubation in OR  Informed Consent: I have reviewed the patients History and Physical, chart, labs and discussed the procedure including the risks, benefits and alternatives for the proposed anesthesia with the patient or authorized representative who has indicated his/her understanding and acceptance.     Dental advisory given  Plan Discussed with: CRNA  Anesthesia Plan Comments: (LMA vs ETT as needed)        Anesthesia Quick Evaluation

## 2018-04-08 ENCOUNTER — Encounter (HOSPITAL_COMMUNITY): Payer: Self-pay | Admitting: Urology

## 2018-04-22 ENCOUNTER — Other Ambulatory Visit: Payer: Self-pay

## 2018-04-22 ENCOUNTER — Ambulatory Visit (INDEPENDENT_AMBULATORY_CARE_PROVIDER_SITE_OTHER): Payer: BC Managed Care – PPO | Admitting: Urology

## 2018-04-22 DIAGNOSIS — N401 Enlarged prostate with lower urinary tract symptoms: Secondary | ICD-10-CM

## 2018-04-22 DIAGNOSIS — R3912 Poor urinary stream: Secondary | ICD-10-CM

## 2018-08-19 ENCOUNTER — Other Ambulatory Visit (HOSPITAL_BASED_OUTPATIENT_CLINIC_OR_DEPARTMENT_OTHER): Payer: Self-pay

## 2018-08-19 DIAGNOSIS — R0683 Snoring: Secondary | ICD-10-CM

## 2018-08-19 DIAGNOSIS — R454 Irritability and anger: Secondary | ICD-10-CM

## 2018-08-19 DIAGNOSIS — G4730 Sleep apnea, unspecified: Secondary | ICD-10-CM

## 2018-08-29 ENCOUNTER — Other Ambulatory Visit: Payer: Self-pay

## 2018-08-29 ENCOUNTER — Other Ambulatory Visit (HOSPITAL_COMMUNITY)
Admission: RE | Admit: 2018-08-29 | Discharge: 2018-08-29 | Disposition: A | Discharge status: Home / Self Care | Payer: BC Managed Care – PPO | Source: Ambulatory Visit | Attending: Neurology | Admitting: Neurology

## 2018-08-29 DIAGNOSIS — Z20828 Contact with and (suspected) exposure to other viral communicable diseases: Secondary | ICD-10-CM | POA: Diagnosis present

## 2018-08-29 LAB — SARS CORONAVIRUS 2 (TAT 6-24 HRS): SARS Coronavirus 2: NEGATIVE

## 2018-09-01 ENCOUNTER — Ambulatory Visit: Payer: BC Managed Care – PPO | Attending: Pulmonary Disease | Admitting: Neurology

## 2018-09-01 ENCOUNTER — Other Ambulatory Visit: Payer: Self-pay

## 2018-09-01 DIAGNOSIS — Z79899 Other long term (current) drug therapy: Secondary | ICD-10-CM | POA: Insufficient documentation

## 2018-09-01 DIAGNOSIS — R454 Irritability and anger: Secondary | ICD-10-CM | POA: Diagnosis not present

## 2018-09-01 DIAGNOSIS — R0683 Snoring: Secondary | ICD-10-CM | POA: Diagnosis not present

## 2018-09-01 DIAGNOSIS — G4730 Sleep apnea, unspecified: Secondary | ICD-10-CM | POA: Insufficient documentation

## 2018-09-05 NOTE — Procedures (Signed)
   Emerson A. Merlene Laughter, MD     [email or web address]             NOCTURNAL POLYSOMNOGRAPHY   LOCATION: [facility]  Patient Name: Ryan Munoz, Ryan Munoz Date: 09/01/2018 Gender: Male D.O.B: 22-Sep-1960 Age (years): 57 Referring Provider: Sinda Du Height (inches): 74 Interpreting Physician: Phillips Odor MD, ABSM Weight (lbs): 277 RPSGT: Peak, Robert BMI: 36 MRN: [id number] Neck Size: 17.00 CLINICAL INFORMATION The patient is referred for a CPAP titration to treat sleep apnea.     Date of NPSG, Split Night or HST:  SLEEP STUDY TECHNIQUE As per the AASM Manual for the Scoring of Sleep and Associated Events v2.3 (April 2016) with a hypopnea requiring 4% desaturations.  The channels recorded and monitored were frontal, central and occipital EEG, electrooculogram (EOG), submentalis EMG (chin), nasal and oral airflow, thoracic and abdominal wall motion, anterior tibialis EMG, snore microphone, electrocardiogram, and pulse oximetry. Continuous positive airway pressure (CPAP) was initiated at the beginning of the study and titrated to treat sleep-disordered breathing.  MEDICATIONS Medications self-administered by patient taken the night of the study : N/A  Current Outpatient Medications:  .  amLODipine (NORVASC) 10 MG tablet, Take 10 mg by mouth daily., Disp: , Rfl:  .  cephALEXin (KEFLEX) 500 MG capsule, Take 1 capsule (500 mg total) by mouth 2 (two) times daily., Disp: 6 capsule, Rfl: 0 .  irbesartan (AVAPRO) 300 MG tablet, Take 300 mg by mouth daily., Disp: , Rfl:  .  loratadine (CLARITIN) 10 MG tablet, Take 10 mg by mouth daily., Disp: , Rfl:  .  Multiple Vitamin (MULTIVITAMIN WITH MINERALS) TABS tablet, Take 1 tablet by mouth daily., Disp: , Rfl:    TECHNICIAN COMMENTS Comments added by technician: PATIENT COMES TO SLEEP CENTER FOR CPAP TITRATION. PATIENT RESPONDED VERY WELL TO CPAP PRESSURE. ALL STAGES OF SLEEP WERE NOTED. SUPINE REM WAS  TITRATED SUCCESSFULLY. ALL AIRWAY OBSTRUCTIONS WERE ELIMINATED. SNORING WAS ELIMINATED. NO LEG MOVEMENTS. ECG SHOWED NORMAL SINUS RHYTHM. MASK FIT WAS OPTIMAL. Comments added by scorer: N/A RESPIRATORY PARAMETERS Optimal PAP Pressure (cm): 10 AHI at Optimal Pressure (/hr): 0.0 Overall Minimal O2 (%): 89.0 Supine % at Optimal Pressure (%): 100 Minimal O2 at Optimal Pressure (%): 94.0   SLEEP ARCHITECTURE The study was initiated at 10:52:32 PM and ended at 5:16:18 AM.  Sleep onset time was 12.9 minutes and the sleep efficiency was 89.9%%. The total sleep time was 344.8 minutes.  The patient spent 4.3%% of the night in stage N1 sleep, 74.9%% in stage N2 sleep, 4.3%% in stage N3 and 16.4% in REM.Stage REM latency was 73.0 minutes  Wake after sleep onset was 26.0. Alpha intrusion was absent. Supine sleep was 79.45%.  CARDIAC DATA The 2 lead EKG demonstrated sinus rhythm. The mean heart rate was 48.6 beats per minute. Other EKG findings include: None. LEG MOVEMENT DATA The total Periodic Limb Movements of Sleep (PLMS) were 0. The PLMS index was 0.0. A PLMS index of <15 is considered normal in adults.  IMPRESSIONS The optimal CPAP is 10 cm of water. Central sleep apnea was not noted during this titration (CAI = 0.3/h).   Delano Metz, MD Diplomate, American Board of Sleep Medicine.  ELECTRONICALLY SIGNED ON:  09/05/2018, 7:26 PM [place] PH: [phone]   FX: [phone] [place]

## 2018-10-14 ENCOUNTER — Ambulatory Visit: Payer: BC Managed Care – PPO | Admitting: Urology

## 2018-12-02 ENCOUNTER — Ambulatory Visit (INDEPENDENT_AMBULATORY_CARE_PROVIDER_SITE_OTHER): Payer: BC Managed Care – PPO | Admitting: Urology

## 2018-12-02 ENCOUNTER — Other Ambulatory Visit: Payer: Self-pay

## 2018-12-02 DIAGNOSIS — N401 Enlarged prostate with lower urinary tract symptoms: Secondary | ICD-10-CM | POA: Diagnosis not present

## 2018-12-02 DIAGNOSIS — R350 Frequency of micturition: Secondary | ICD-10-CM | POA: Diagnosis not present

## 2019-03-21 ENCOUNTER — Other Ambulatory Visit: Payer: Self-pay

## 2019-03-21 ENCOUNTER — Ambulatory Visit (INDEPENDENT_AMBULATORY_CARE_PROVIDER_SITE_OTHER)
Admission: RE | Admit: 2019-03-21 | Discharge: 2019-03-21 | Disposition: A | Discharge status: Home / Self Care | Payer: BC Managed Care – PPO | Source: Ambulatory Visit

## 2019-03-21 DIAGNOSIS — I10 Essential (primary) hypertension: Secondary | ICD-10-CM | POA: Diagnosis not present

## 2019-03-21 DIAGNOSIS — Z760 Encounter for issue of repeat prescription: Secondary | ICD-10-CM | POA: Diagnosis not present

## 2019-03-21 MED ORDER — IRBESARTAN 300 MG PO TABS: 300.0000 mg | ORAL_TABLET | Freq: Every day | ORAL | 0 refills | Status: DC

## 2019-03-21 NOTE — Discharge Instructions (Signed)
A refill for a 30-day supply of your irbesartan was given today.    Establish yourself with a new primary care provider such as the one listed below.    Continue to take your blood pressure at home and keep a log of this to show to your new primary care provider at your first visit.    Come here to be seen in person if you develop symptoms associated with your high blood pressure.

## 2019-03-21 NOTE — ED Provider Notes (Signed)
Virtual Visit via Video Note:  Ryan Munoz  initiated request for Telemedicine visit with [facility] Urgent Care team. I connected with Ryan Munoz  on 03/21/2019 at 4:41 PM  for a synchronized telemedicine visit using a video enabled HIPPA compliant telemedicine application. I verified that I am speaking with Ryan Munoz  using two identifiers. Mickie Bail, NP  was physically located in a [facility] Urgent care site and Ryan Munoz was located at a different location.   The limitations of evaluation and management by telemedicine as well as the availability of in-person appointments were discussed. Patient was informed that he  may incur a bill ( including co-pay) for this virtual visit encounter. Ryan Munoz  expressed understanding and gave verbal consent to proceed with virtual visit.     History of Present Illness:Ryan Munoz  is a 59 y.o. male presents with request for a refill on his blood pressure medication, irbesartan.  He states he has enough of his other medications currently.  He states his PCP retired and he has no current PCP now.  He is asymptomatic; he denies chest pain, shortness of breath, dizziness, headache, weakness, or other symptoms.  He states he monitors his blood pressure at home.   No Known Allergies   Past Medical History:  Diagnosis Date  . Arthritis    degenerative disc disease  . Enlarged prostate   . Hypertension   . Sleep apnea      Social History   Tobacco Use  . Smoking status: Never Smoker  . Smokeless tobacco: Never Used  Substance Use Topics  . Alcohol use: No  . Drug use: No    ROS: as stated in HPI.  All other systems reviewed and negative.      Observations/Objective: Physical Exam  VITALS: Patient denies fever. GENERAL: Alert, appears well and in no acute distress. HEENT: Atraumatic. NECK: Normal movements of the head and neck. CARDIOPULMONARY: No increased WOB. Speaking in clear  sentences. I:E ratio WNL.  MS: Moves all visible extremities without noticeable abnormality. PSYCH: Pleasant and cooperative, well-groomed. Speech normal rate and rhythm. Affect is appropriate. Insight and judgement are appropriate. Attention is focused, linear, and appropriate.  NEURO: CN grossly intact. Oriented as arrived to appointment on time with no prompting. Moves both UE equally.  SKIN: No obvious lesions, wounds, erythema, or cyanosis noted on face or hands.   Assessment and Plan:    ICD-10-CM   1. Encounter for medication refill  Z76.0   2. Essential hypertension  I10        Follow Up Instructions: 30-day supply of irbesartan given today.  Patient instructed to establish a new PCP.  Instructed patient to continue taking his blood pressure at home and keep a log of it for his new PCP.  Instructed patient to come in to be seen in person if he has any symptoms associated with his high blood pressure.  Patient agrees to plan of care.    I discussed the assessment and treatment plan with the patient. The patient was provided an opportunity to ask questions and all were answered. The patient agreed with the plan and demonstrated an understanding of the instructions.   The patient was advised to call back or seek an in-person evaluation if the symptoms worsen or if the condition fails to improve as anticipated.      Mickie Bail, NP  03/21/2019 4:41 PM  Sharion Balloon, NP 03/21/19 [phone]

## 2019-04-13 ENCOUNTER — Ambulatory Visit
Admission: RE | Admit: 2019-04-13 | Discharge: 2019-04-13 | Disposition: A | Discharge status: Home / Self Care | Payer: BC Managed Care – PPO | Source: Ambulatory Visit

## 2019-04-13 ENCOUNTER — Other Ambulatory Visit: Payer: Self-pay

## 2019-04-13 ENCOUNTER — Ambulatory Visit (INDEPENDENT_AMBULATORY_CARE_PROVIDER_SITE_OTHER)
Admission: EM | Admit: 2019-04-13 | Discharge: 2019-04-13 | Disposition: A | Discharge status: Home / Self Care | Payer: BC Managed Care – PPO | Source: Home / Self Care

## 2019-04-13 ENCOUNTER — Telehealth: Payer: BC Managed Care – PPO

## 2019-04-13 DIAGNOSIS — Z760 Encounter for issue of repeat prescription: Secondary | ICD-10-CM

## 2019-04-13 DIAGNOSIS — I10 Essential (primary) hypertension: Secondary | ICD-10-CM | POA: Diagnosis not present

## 2019-04-13 MED ORDER — IRBESARTAN 300 MG PO TABS: 300.0000 mg | ORAL_TABLET | Freq: Every day | ORAL | 0 refills | Status: DC

## 2019-04-13 MED ORDER — AMLODIPINE BESYLATE 10 MG PO TABS: 10.0000 mg | ORAL_TABLET | Freq: Every day | ORAL | 0 refills | Status: DC

## 2019-04-13 NOTE — Discharge Instructions (Addendum)
Follow up with your new primary care provider as scheduled in July.    Come to the urgent care to be seen in person sooner if you have any concerning symptoms.

## 2019-04-13 NOTE — ED Provider Notes (Signed)
Virtual Visit via Video Note:  Ryan Munoz  initiated request for Telemedicine visit with [facility] Urgent Care team. I connected with Ryan Munoz  on 04/13/2019 at 3:45 PM  for a synchronized telemedicine visit using a video enabled HIPPA compliant telemedicine application. I verified that I am speaking with Ryan Munoz  using two identifiers. Mickie Bail, NP  was physically located in a [facility] Urgent care site and Ryan Munoz was located at a different location.   The limitations of evaluation and management by telemedicine as well as the availability of in-person appointments were discussed. Patient was informed that he  may incur a bill ( including co-pay) for this virtual visit encounter. Ryan Munoz  expressed understanding and gave verbal consent to proceed with virtual visit.     History of Present Illness:Ryan Munoz  is a 59 y.o. male presents with request for a refill on his blood pressure medications.  He states he does not currently have a primary care provider but has an appointment in July to establish Dr. Nehemiah Settle as his new PCP.  He is asymptomatic; he denies chest pain, shortness of breath, dizziness, headache, weakness, or other symptoms.  He states he monitors his blood pressure at home.   No Known Allergies   Past Medical History:  Diagnosis Date  . Arthritis    degenerative disc disease  . Enlarged prostate   . Hypertension   . Sleep apnea      Social History   Tobacco Use  . Smoking status: Never Smoker  . Smokeless tobacco: Never Used  Substance Use Topics  . Alcohol use: No  . Drug use: No   ROS: as stated in HPI.  All other systems reviewed and negative.       Observations/Objective: Physical Exam  VITALS: Patient denies fever. GENERAL: Alert, appears well and in no acute distress. HEENT: Atraumatic. NECK: Normal movements of the head and neck. CARDIOPULMONARY: No increased WOB. Speaking in clear  sentences. I:E ratio WNL.  MS: Moves all visible extremities without noticeable abnormality. PSYCH: Pleasant and cooperative, well-groomed. Speech normal rate and rhythm. Affect is appropriate. Insight and judgement are appropriate. Attention is focused, linear, and appropriate.  NEURO: CN grossly intact. Oriented as arrived to appointment on time with no prompting. Moves both UE equally.  SKIN: No obvious lesions, wounds, erythema, or cyanosis noted on face or hands.   Assessment and Plan:    ICD-10-CM   1. Encounter for medication refill  Z76.0   2. Essential hypertension  I10        Follow Up Instructions: 90-day supply of amlodipine and irbesartan provided.  Instructed patient to establish Dr. Nehemiah Settle as his new PCP as scheduled in July.  Instructed him to come to the urgent care to be seen sooner if he becomes symptomatic.  Patient agrees to plan of care.     I discussed the assessment and treatment plan with the patient. The patient was provided an opportunity to ask questions and all were answered. The patient agreed with the plan and demonstrated an understanding of the instructions.   The patient was advised to call back or seek an in-person evaluation if the symptoms worsen or if the condition fails to improve as anticipated.      Mickie Bail, NP  04/13/2019 3:45 PM         Mickie Bail, NP 04/13/19 1545

## 2019-06-19 ENCOUNTER — Encounter: Payer: Self-pay | Admitting: Orthopedic Surgery

## 2019-06-19 ENCOUNTER — Ambulatory Visit: Payer: Self-pay

## 2019-06-19 ENCOUNTER — Ambulatory Visit: Payer: BC Managed Care – PPO | Admitting: Orthopedic Surgery

## 2019-06-19 ENCOUNTER — Other Ambulatory Visit: Payer: Self-pay

## 2019-06-19 VITALS — Ht 74.5 in | Wt 280.0 lb

## 2019-06-19 DIAGNOSIS — M79671 Pain in right foot: Secondary | ICD-10-CM

## 2019-06-19 DIAGNOSIS — M2021 Hallux rigidus, right foot: Secondary | ICD-10-CM | POA: Diagnosis not present

## 2019-06-19 DIAGNOSIS — M722 Plantar fascial fibromatosis: Secondary | ICD-10-CM

## 2019-06-19 NOTE — Progress Notes (Signed)
Office Visit Note   Patient: Ryan Munoz           Date of Birth: [birth year]           MRN: [id number] Visit Date: 06/19/2019              Requested by: No referring provider defined for this encounter. PCP: Patient, No Pcp Per  Chief Complaint  Patient presents with  . Right Foot - Pain      HPI: Patient is a 59 year old gentleman who presents complaining of decreased range of motion and pain right great toe MTP joint as well as a plantar fibromatosis mass right foot.  Patient states the mass has been injected before with steroid by podiatry he states that this did not change the size of the mass.  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  Assessment & Plan: Visit Diagnoses:  1. Pain in right foot   2. Hallux rigidus, right foot   3. Plantar fascial fibromatosis     Plan: Discussed that conservative treatment would include orthotics and a stiff soled shoe to decrease range of motion through the MTP joint.  Discussed that surgical treatment would be to proceed with a fusion of the great toe MTP joint and excision of the fibromatosis mass with this mass being sent to pathology for evaluation.  Discussed that he would need to ambulate through the heel for 3 weeks until the incision healed and then we could advance him to weightbearing in either a postoperative shoe or cam boot.  Patient states he would like to call to set up surgery.  Follow-Up Instructions: Return if symptoms worsen or fail to improve, for Patient will call to schedule surgery.Gaylord Shih Exam  Patient is alert, oriented, no adenopathy, well-dressed, normal affect, normal respiratory effort. Examination patient has a good dorsalis pedis pulse he has a plantar fibromatosis mass over the mid aspect of the plantar fascia which is approximately 2 cm in diameter.  He has no Dupuytren's contractures on either hand.  Examination the great toe is a very large osteophytic bone spur dorsally with plantar flexion of  0 degrees and dorsiflexion of 20 degrees.  Imaging: XR Foot Complete Right  Result Date: 06/19/2019 Three-view radiographs of the right foot shows significant hallux rigidus with complete collapse of the joint space right great toe MTP joint with a very large dorsal osteophytic bone spur.  No images are attached to the encounter.  Labs: No results found for: HGBA1C, ESRSEDRATE, CRP, LABURIC, REPTSTATUS, GRAMSTAIN, CULT, LABORGA   No results found for: ALBUMIN, PREALBUMIN, LABURIC  No results found for: MG No results found for: VD25OH  No results found for: PREALBUMIN No flowsheet data found.   Body mass index is 35.47 kg/m.  Orders:  Orders Placed This Encounter  Procedures  . XR Foot Complete Right   No orders of the defined types were placed in this encounter.    Procedures: No procedures performed  Clinical Data: No additional findings.  ROS:  All other systems negative, except as noted in the HPI. Review of Systems  Objective: Vital Signs: Ht 6' 2.5" (1.892 m)   Wt 280 lb (127 kg)   BMI 35.47 kg/m   Specialty Comments:  No specialty comments available.  PMFS History: There are no problems to display for this patient.  Past Medical History:  Diagnosis Date  . Arthritis    degenerative disc disease  . Enlarged prostate   . Hypertension   .  Sleep apnea     Family History  Problem Relation Age of Onset  . Cancer Mother   . Cancer Sister   . Diabetes Father   . Hypertension Father     Past Surgical History:  Procedure Laterality Date  . APPENDECTOMY    . BACK SURGERY    . CYSTOSCOPY WITH INSERTION OF UROLIFT N/A 04/07/2018   Procedure: CYSTOSCOPY WITH INSERTION OF UROLIFT;  Surgeon: Franchot Gallo, MD;  Location: [facility];  Service: Urology;  Laterality: N/A;   Social History   Occupational History  . Not on file  Tobacco Use  . Smoking status: Never Smoker  . Smokeless tobacco: Never Used  Substance and Sexual Activity  . Alcohol  use: No  . Drug use: No  . Sexual activity: Yes    Birth control/protection: None

## 2019-06-20 ENCOUNTER — Telehealth: Payer: Self-pay

## 2019-06-20 NOTE — Telephone Encounter (Signed)
Pt was in the office yesterday and you discussed possible fusion MTP joint and excision of fibromatosis mass to sent to pathology. Please see the message below he has more questions about surgery and recovery please call.

## 2019-06-20 NOTE — Telephone Encounter (Signed)
Patient has some more questions about surgery that was recommended.  Would like more detail about hardware, procedure, risks, recovery and long-term effects.  Please call.

## 2019-06-21 NOTE — Telephone Encounter (Signed)
I called and answered questions about fusion

## 2019-07-25 ENCOUNTER — Other Ambulatory Visit: Payer: Self-pay

## 2019-07-25 ENCOUNTER — Ambulatory Visit (INDEPENDENT_AMBULATORY_CARE_PROVIDER_SITE_OTHER)
Admission: RE | Admit: 2019-07-25 | Discharge: 2019-07-25 | Disposition: A | Discharge status: Home / Self Care | Payer: BC Managed Care – PPO | Source: Ambulatory Visit

## 2019-07-25 DIAGNOSIS — I10 Essential (primary) hypertension: Secondary | ICD-10-CM | POA: Diagnosis not present

## 2019-07-25 DIAGNOSIS — Z760 Encounter for issue of repeat prescription: Secondary | ICD-10-CM

## 2019-07-25 MED ORDER — AMLODIPINE BESYLATE 10 MG PO TABS: 10.0000 mg | ORAL_TABLET | Freq: Every day | ORAL | 0 refills | Status: AC

## 2019-07-25 MED ORDER — IRBESARTAN 300 MG PO TABS: 300.0000 mg | ORAL_TABLET | Freq: Every day | ORAL | 0 refills | Status: AC

## 2019-07-25 NOTE — ED Provider Notes (Signed)
Virtual Visit via Video Note:  Ryan Munoz  initiated request for Telemedicine visit with [facility] Urgent Care team. I connected with Ryan Munoz  on 07/25/2019 at 10:42 AM  for a synchronized telemedicine visit using a video enabled HIPPA compliant telemedicine application. I verified that I am speaking with Ryan Munoz  using two identifiers. Ryan Bail, NP  was physically located in a [facility] Urgent care site and Ryan Munoz was located at a different location.   The limitations of evaluation and management by telemedicine as well as the availability of in-person appointments were discussed. Patient was informed that he  may incur a bill ( including co-pay) for this virtual visit encounter. Ryan Munoz  expressed understanding and gave verbal consent to proceed with virtual visit.     History of Present Illness:Ryan Munoz  is a 59 y.o. male presents with request for refill of HTN meds.  He does not currently have PCP; He has an appointment to establish Dr. Nehemiah Settle as his new PCP in July.  Patient was seen for refill on HTN meds on 04/13/2019.  He denies symptoms including weakness, dizziness, headache, chest pain, palpitations, SOB, edema, or other concerns.      No Known Allergies   Past Medical History:  Diagnosis Date  . Arthritis    degenerative disc disease  . Enlarged prostate   . Hypertension   . Sleep apnea      Social History   Tobacco Use  . Smoking status: Never Smoker  . Smokeless tobacco: Never Used  Vaping Use  . Vaping Use: Never used  Substance Use Topics  . Alcohol use: No  . Drug use: No    ROS: as stated in HPI.  All other systems reviewed and negative.      Observations/Objective: Physical Exam  Unable to connect by video; telephone visit only.   VITALS: Patient denies fever. GENERAL: No sounds of acute distress. CARDIOPULMONARY:  Speaking in clear sentences.   PSYCH: Pleasant and conversational.  Speech normal rate and rhythm.  Attention is focused, linear, and appropriate.    Assessment and Plan:    ICD-10-CM   1. Encounter for medication refill  Z76.0   2. Hypertension, essential  I10        Follow Up Instructions: Refills provided on amlodipine and irbesartan.  Instructed patient to continue monitoring his blood pressure at home.  Instructed him to follow-up as scheduled in July to establish Dr. Nehemiah Settle as his new PCP.  Patient agrees to plan of care.      I discussed the assessment and treatment plan with the patient. The patient was provided an opportunity to ask questions and all were answered. The patient agreed with the plan and demonstrated an understanding of the instructions.   The patient was advised to call back or seek an in-person evaluation if the symptoms worsen or if the condition fails to improve as anticipated.      Ryan Bail, NP  07/25/2019 10:42 AM         Ryan Bail, NP 07/25/19 1042

## 2019-07-25 NOTE — Discharge Instructions (Signed)
A refill for your blood pressure medications has been provided.    Please follow up with your new primary care provider at your scheduled appointment in July.

## 2019-10-05 ENCOUNTER — Ambulatory Visit: Payer: BC Managed Care – PPO | Admitting: Nutrition

## 2019-10-19 ENCOUNTER — Ambulatory Visit: Payer: BC Managed Care – PPO | Admitting: Nutrition

## 2019-10-19 ENCOUNTER — Telehealth: Payer: Self-pay | Admitting: Nutrition

## 2019-10-19 NOTE — Telephone Encounter (Signed)
Unable to  Leave vm as VM is full to reschedule missed appt.

## 2020-04-02 ENCOUNTER — Ambulatory Visit
Admission: RE | Admit: 2020-04-02 | Discharge: 2020-04-02 | Disposition: A | Discharge status: Home / Self Care | Payer: BC Managed Care – PPO | Source: Ambulatory Visit | Attending: Internal Medicine | Admitting: Internal Medicine

## 2020-04-02 ENCOUNTER — Other Ambulatory Visit: Payer: Self-pay | Admitting: Internal Medicine

## 2020-04-02 DIAGNOSIS — R42 Dizziness and giddiness: Secondary | ICD-10-CM

## 2020-06-27 ENCOUNTER — Ambulatory Visit: Payer: BC Managed Care – PPO | Admitting: Urology

## 2020-06-27 ENCOUNTER — Encounter: Payer: Self-pay | Admitting: Urology

## 2020-06-27 ENCOUNTER — Other Ambulatory Visit: Payer: Self-pay

## 2020-06-27 VITALS — BP 128/82 | HR 61 | Temp 98.4°F | Ht 74.5 in | Wt 280.0 lb

## 2020-06-27 DIAGNOSIS — R3129 Other microscopic hematuria: Secondary | ICD-10-CM | POA: Diagnosis not present

## 2020-06-27 DIAGNOSIS — N401 Enlarged prostate with lower urinary tract symptoms: Secondary | ICD-10-CM | POA: Diagnosis not present

## 2020-06-27 DIAGNOSIS — R350 Frequency of micturition: Secondary | ICD-10-CM | POA: Diagnosis not present

## 2020-06-27 DIAGNOSIS — N5201 Erectile dysfunction due to arterial insufficiency: Secondary | ICD-10-CM | POA: Diagnosis not present

## 2020-06-27 LAB — URINALYSIS, ROUTINE W REFLEX MICROSCOPIC
Bilirubin, UA: NEGATIVE
Glucose, UA: NEGATIVE
Ketones, UA: NEGATIVE
Leukocytes,UA: NEGATIVE
Nitrite, UA: NEGATIVE
Protein,UA: NEGATIVE
Specific Gravity, UA: 1.015 (ref 1.005–1.030)
Urobilinogen, Ur: 0.2 mg/dL (ref 0.2–1.0)
pH, UA: 7 (ref 5.0–7.5)

## 2020-06-27 LAB — MICROSCOPIC EXAMINATION
Bacteria, UA: NONE SEEN
Epithelial Cells (non renal): NONE SEEN /hpf (ref 0–10)
RBC, Urine: NONE SEEN /hpf (ref 0–2)
Renal Epithel, UA: NONE SEEN /hpf
WBC, UA: NONE SEEN /hpf (ref 0–5)

## 2020-06-27 MED ORDER — SILDENAFIL CITRATE 20 MG PO TABS: ORAL_TABLET | ORAL | 11 refills | Status: AC

## 2020-06-27 NOTE — Progress Notes (Signed)
Urological Symptom Review  Patient is experiencing the following symptoms: Get up at night to urinate Blood in urine   Review of Systems  Gastrointestinal (upper)  : Negative for upper GI symptoms  Gastrointestinal (lower) : Negative for lower GI symptoms  Constitutional : Negative for symptoms  Skin: Negative for skin symptoms  Eyes: Negative for eye symptoms  Ear/Nose/Throat : Negative for Ear/Nose/Throat symptoms  Hematologic/Lymphatic: Negative for Hematologic/Lymphatic symptoms  Cardiovascular : Negative for cardiovascular symptoms  Respiratory : Negative for respiratory symptoms  Endocrine: Negative for endocrine symptoms  Musculoskeletal: Back pain  Neurological: Negative for neurological symptoms  Psychologic: Negative for psychiatric symptoms

## 2020-06-27 NOTE — Progress Notes (Signed)
Subjective: 1. Benign localized prostatic hyperplasia with lower urinary tract symptoms (LUTS)   2. Urinary frequency   3. Microhematuria   4. Erectile dysfunction due to arterial insufficiency     Ryan Munoz is a 60 yo male who has a history of BPH with BOO and Urolift on 04/07/18 and he is content with his symptoms.  His IPSS is 3 but he has some frequency but drinks a lot of fluid and does drinks diet drinks.  He has nocturia x 1.   He is sent back by Dr. Nehemiah Settle for a recent episode of hematuria.   He had COVID in the last 2 weeks of January and had an episode of dizziness following that.   He had no gross hematuria.   He was found to have trace blood only on 2 UA's.   His UA today has trace blood again but no RBC's on micro.  He has some mild ED and he is interested in an oral med.  He got married in 2020.     ROS:  ROS  No Known Allergies  Past Medical History:  Diagnosis Date  . Arthritis    degenerative disc disease  . Enlarged prostate   . Hypertension   . Sleep apnea     Past Surgical History:  Procedure Laterality Date  . APPENDECTOMY    . BACK SURGERY    . CYSTOSCOPY WITH INSERTION OF UROLIFT N/A 04/07/2018   Procedure: CYSTOSCOPY WITH INSERTION OF UROLIFT;  Surgeon: Marcine Matar, MD;  Location: [facility];  Service: Urology;  Laterality: N/A;    Social History   Socioeconomic History  . Marital status: Married    Spouse name: Not on file  . Number of children: Not on file  . Years of education: Not on file  . Highest education level: Not on file  Occupational History  . Not on file  Tobacco Use  . Smoking status: Never Smoker  . Smokeless tobacco: Never Used  Vaping Use  . Vaping Use: Never used  Substance and Sexual Activity  . Alcohol use: No  . Drug use: No  . Sexual activity: Yes    Birth control/protection: None  Other Topics Concern  . Not on file  Social History Narrative  . Not on file   Social Determinants of Health   Financial Resource Strain:  Not on file  Food Insecurity: Not on file  Transportation Needs: Not on file  Physical Activity: Not on file  Stress: Not on file  Social Connections: Not on file  Intimate Partner Violence: Not on file    Family History  Problem Relation Age of Onset  . Cancer Mother   . Cancer Sister   . Diabetes Father   . Hypertension Father     Anti-infectives: Anti-infectives (From admission, onward)   None      Current Outpatient Medications  Medication Sig Dispense Refill  . amLODipine (NORVASC) 10 MG tablet Take 1 tablet (10 mg total) by mouth daily. 90 tablet 0  . irbesartan (AVAPRO) 300 MG tablet Take 1 tablet (300 mg total) by mouth daily. 90 tablet 0  . sildenafil (REVATIO) 20 MG tablet 1-5 tabs po one hour prior to activity 30 tablet 11   No current facility-administered medications for this visit.     Objective: Vital signs in last 24 hours: BP 128/82   Pulse 61   Temp 98.4 F (36.9 C) (Oral)   Ht 6' 2.5" (1.892 m)   Wt 280 lb (127  kg)   BMI 35.47 kg/m   Intake/Output from previous day: No intake/output data recorded. Intake/Output this shift: @IOTHISSHIFT @   Physical Exam  Lab Results:  Results for orders placed or performed in visit on 06/27/20 (from the past 24 hour(s))  Urinalysis, Routine w reflex microscopic     Status: Abnormal   Collection Time: 06/27/20  2:44 PM  Result Value Ref Range   Specific Gravity, UA 1.015 1.005 - 1.030   pH, UA 7.0 5.0 - 7.5   Color, UA Yellow Yellow   Appearance Ur Clear Clear   Leukocytes,UA Negative Negative   Protein,UA Negative Negative/Trace   Glucose, UA Negative Negative   Ketones, UA Negative Negative   RBC, UA Trace (A) Negative   Bilirubin, UA Negative Negative   Urobilinogen, Ur 0.2 0.2 - 1.0 mg/dL   Nitrite, UA Negative Negative   Microscopic Examination See below:    Narrative   Performed at:  [address] - [facility] [address] [place], [place]  [place] Lab Director: [id number],  Phone:  [phone]  Microscopic Examination     Status: None   Collection Time: 06/27/20  2:44 PM   Urine  Result Value Ref Range   WBC, UA None seen 0 - 5 /hpf   RBC None seen 0 - 2 /hpf   Epithelial Cells (non renal) None seen 0 - 10 /hpf   Renal Epithel, UA None seen None seen /hpf   Bacteria, UA None seen None seen/Few   Narrative   Performed at:  [address] - [facility] [address], [place], [place]  [place] Lab Director: [id number], Phone:  [phone]    BMET No results for input(s): NA, K, CL, CO2, GLUCOSE, BUN, CREATININE, CALCIUM in the last 72 hours. PT/INR No results for input(s): LABPROT, INR in the last 72 hours. ABG No results for input(s): PHART, HCO3 in the last 72 hours.  Invalid input(s): PCO2, PO2  Studies/Results: No results found.  I have reviewed the records and labs from Dr. [id number].    Assessment/Plan: 1.Microhematuria.  He has only had trace blood on 3 UA's and doesn't meet the criteria for evaluation.  I will just see him back in 6 months.  2.BPH with BOO and frequency.  He continues to do well since the Urolift but has some frequency and urgency.  I have recommended he cut out dietary irritants which for him are primarily diet drinks.  3.ED.   I have given him a script for sildenafil 20mg  to use 1-5 po prn.  Side effects and instructions reviewed in detail.   Meds ordered this encounter  Medications  . sildenafil (REVATIO) 20 MG tablet    Sig: 1-5 tabs po one hour prior to activity    Dispense:  30 tablet    Refill:  11     Orders Placed This Encounter  Procedures  . Microscopic Examination  . Urinalysis, Routine w reflex microscopic     Return in about 6 months (around 12/28/2020).    CC: Dr.   12/30/2020 06/27/2020 [name] ID: [id number], male   DOB: 08/17/1960, 60 y.o.   MRN: [id number]

## 2021-11-27 ENCOUNTER — Ambulatory Visit (INDEPENDENT_AMBULATORY_CARE_PROVIDER_SITE_OTHER): Payer: BC Managed Care – PPO

## 2021-11-27 ENCOUNTER — Ambulatory Visit: Payer: BC Managed Care – PPO | Admitting: Orthopedic Surgery

## 2021-11-27 DIAGNOSIS — M25561 Pain in right knee: Secondary | ICD-10-CM

## 2021-11-27 DIAGNOSIS — G8929 Other chronic pain: Secondary | ICD-10-CM

## 2021-11-27 DIAGNOSIS — M1711 Unilateral primary osteoarthritis, right knee: Secondary | ICD-10-CM

## 2021-11-28 ENCOUNTER — Encounter: Payer: Self-pay | Admitting: Orthopedic Surgery

## 2021-11-28 NOTE — Progress Notes (Signed)
Office Visit Note   Patient: Ryan Munoz           Date of Birth: 13-Mar-1960           MRN: [id number] Visit Date: 11/27/2021              Requested by: No referring provider defined for this encounter. PCP: Patient, No Pcp Per  Chief Complaint  Patient presents with   Right Knee - Pain      HPI: Patient is a 61 year old gentleman who is seen in follow-up for osteoarthritis right knee.  Patient states he has had chronic pain he states that he has more locking symptoms recently.  Patient states that his brother is status post total knee arthroplasty.  He states he has had symptoms for about 2 to 3 years.  Assessment & Plan: Visit Diagnoses:  1. Chronic pain of right knee   2. Unilateral primary osteoarthritis, right knee     Plan: Discussed conservative versus operative treatment risks and benefits of surgery were discussed.  Patient states he understands he will continue with conservative therapy and call if he wishes to proceed with total knee arthroplasty.  Follow-Up Instructions: Return if symptoms worsen or fail to improve.   Ortho Exam  Patient is alert, oriented, no adenopathy, well-dressed, normal affect, normal respiratory effort. Examination there is crepitation with range of motion of the right knee there is a mild effusion.  Collaterals and cruciates are stable.  Imaging: No results found. No images are attached to the encounter.  Labs: No results found for: "HGBA1C", "ESRSEDRATE", "CRP", "LABURIC", "REPTSTATUS", "GRAMSTAIN", "CULT", "LABORGA"   No results found for: "ALBUMIN", "PREALBUMIN", "CBC"  No results found for: "MG" No results found for: "VD25OH"  No results found for: "PREALBUMIN"     No data to display           There is no height or weight on file to calculate BMI.  Orders:  Orders Placed This Encounter  Procedures   XR Knee 1-2 Views Right   No orders of the defined types were placed in this encounter.     Procedures: No procedures performed  Clinical Data: No additional findings.  ROS:  All other systems negative, except as noted in the HPI. Review of Systems  Objective: Vital Signs: There were no vitals taken for this visit.  Specialty Comments:  No specialty comments available.  PMFS History: There are no problems to display for this patient.  Past Medical History:  Diagnosis Date   Arthritis    degenerative disc disease   Enlarged prostate    Hypertension    Sleep apnea     Family History  Problem Relation Age of Onset   Cancer Mother    Cancer Sister    Diabetes Father    Hypertension Father     Past Surgical History:  Procedure Laterality Date   APPENDECTOMY     BACK SURGERY     CYSTOSCOPY WITH INSERTION OF UROLIFT N/A 04/07/2018   Procedure: CYSTOSCOPY WITH INSERTION OF UROLIFT;  Surgeon: Franchot Gallo, MD;  Location: [facility];  Service: Urology;  Laterality: N/A;   Social History   Occupational History   Not on file  Tobacco Use   Smoking status: Never   Smokeless tobacco: Never  Vaping Use   Vaping Use: Never used  Substance and Sexual Activity   Alcohol use: No   Drug use: No   Sexual activity: Yes    Birth control/protection: None

## 2022-02-24 IMAGING — CT CT HEAD W/O CM
4 series · 16 of 47 positions shown, 18 images · non-contrast
Comparison: None.

CLINICAL DATA: Dizziness

EXAM:
CT HEAD WITHOUT CONTRAST
TECHNIQUE: Contiguous axial images were obtained from the base of the skull
through the vertex without intravenous contrast.

[Series 2: head 5.00 hr40 s3 axial ibhc · axial · 0.48mm/px · z∈[-650,-515]mm · 7 of 37 slices shown, 9 images]
[im 5/37  brain]
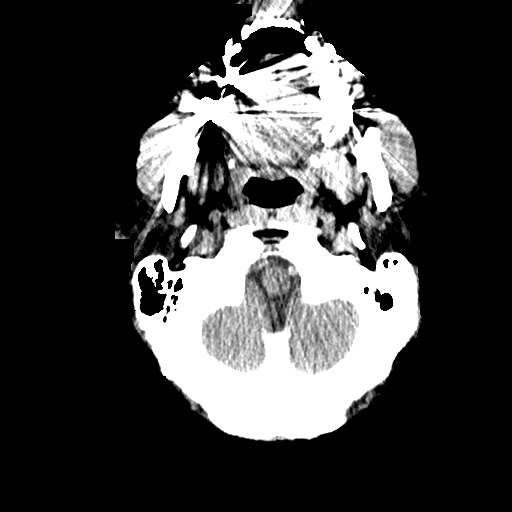
[im 5/37  bone]
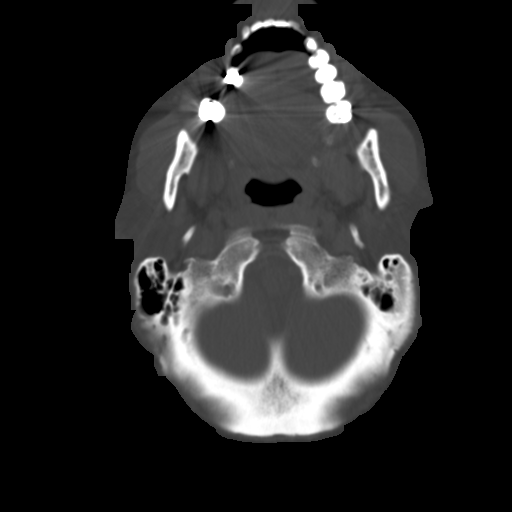
[im 10/37  brain]
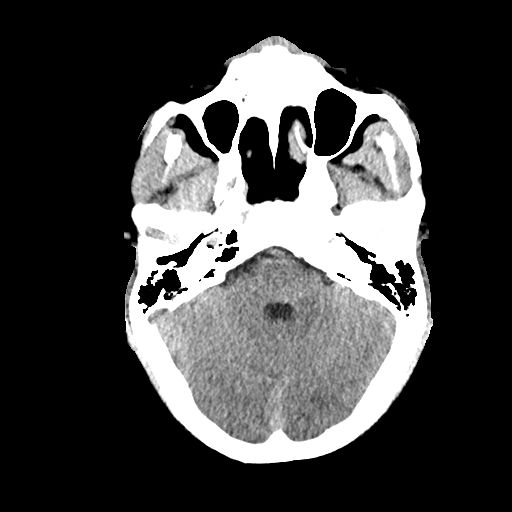
[im 14/37  brain]
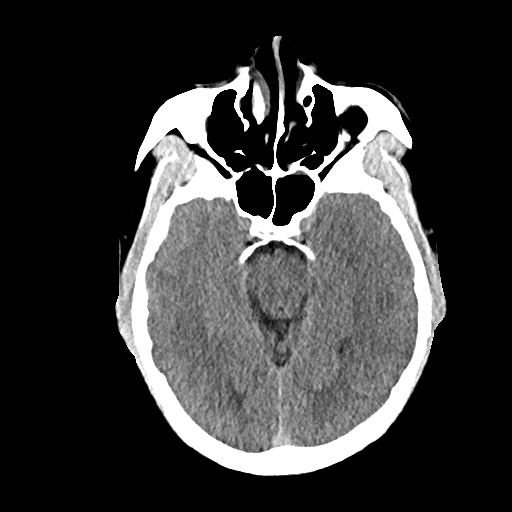
[im 19/37  brain]
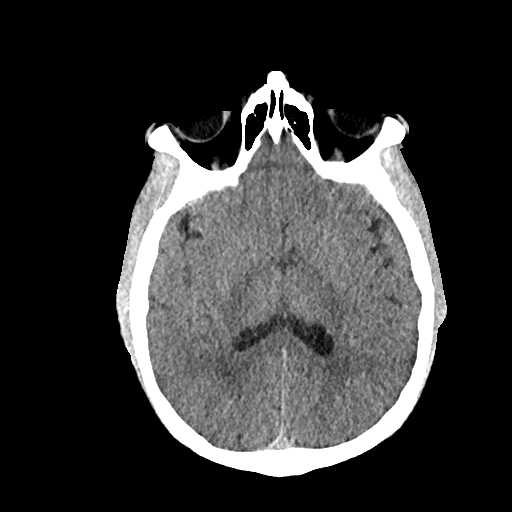
[im 23/37  brain]
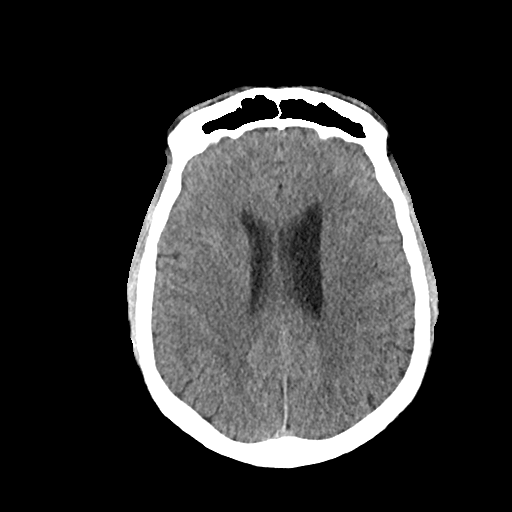
[im 23/37  bone]
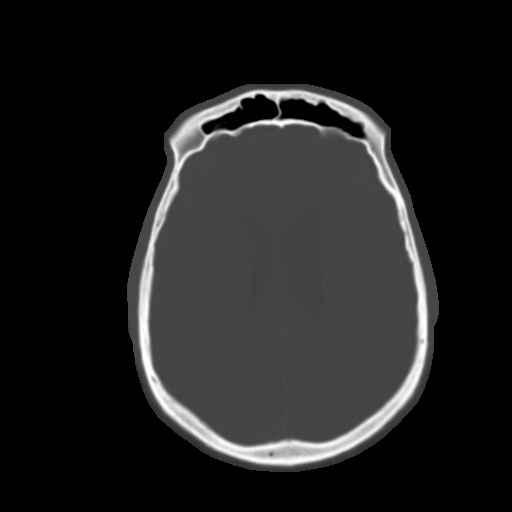
[im 28/37  brain]
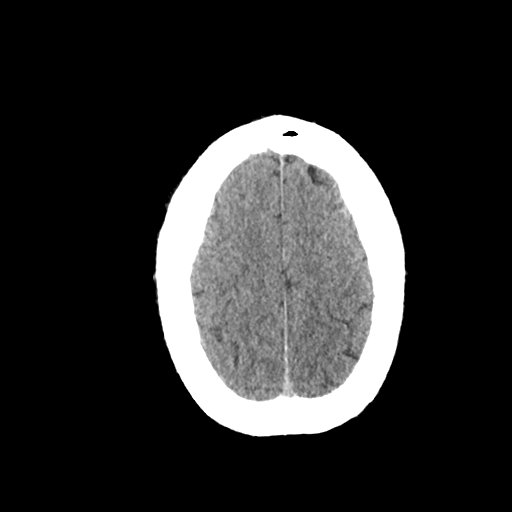
[im 32/37  brain]
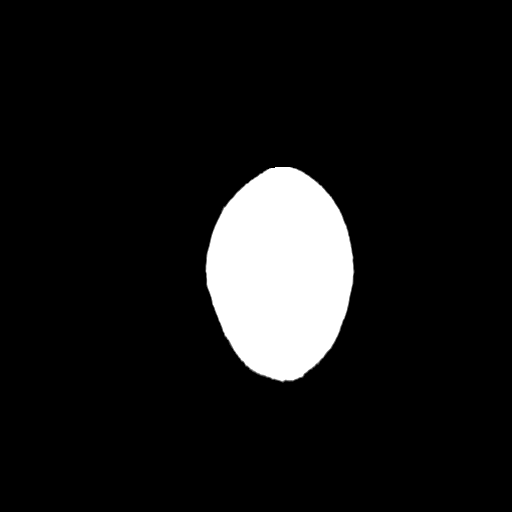

[Series 3: head 2.00 hr60 s3 axial bone · axial · 0.48mm/px · z∈[-654,-618]mm · 3 of 93 slices shown]
[im 10/93  bone]
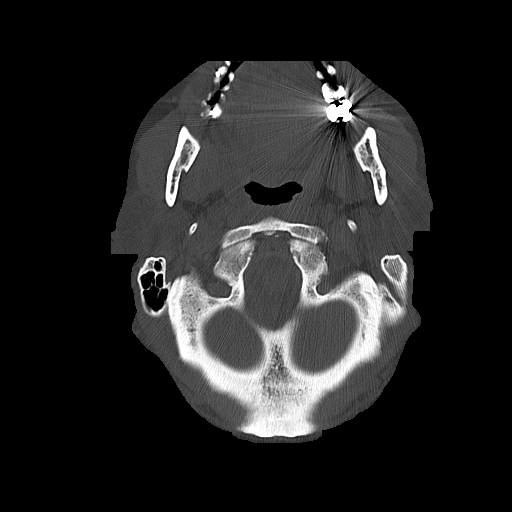
[im 19/93  bone]
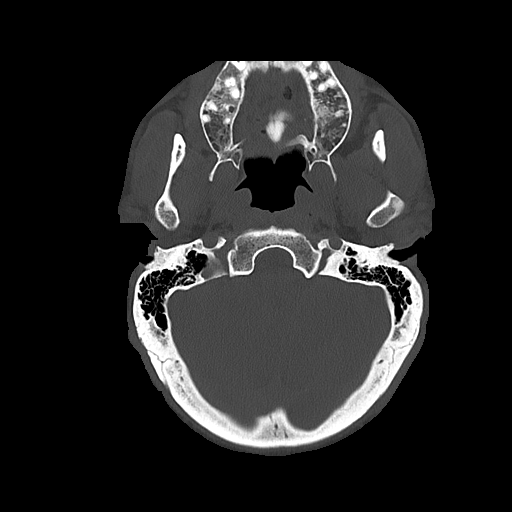
[im 28/93  bone]
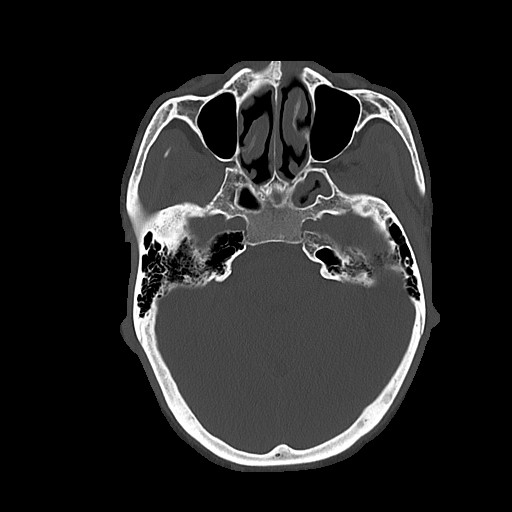

[Series 4: head 3.00 hr40 s3 sag · sagittal · 0.36mm/px · 3 of 82 slices shown]
[im 28/82  brain]
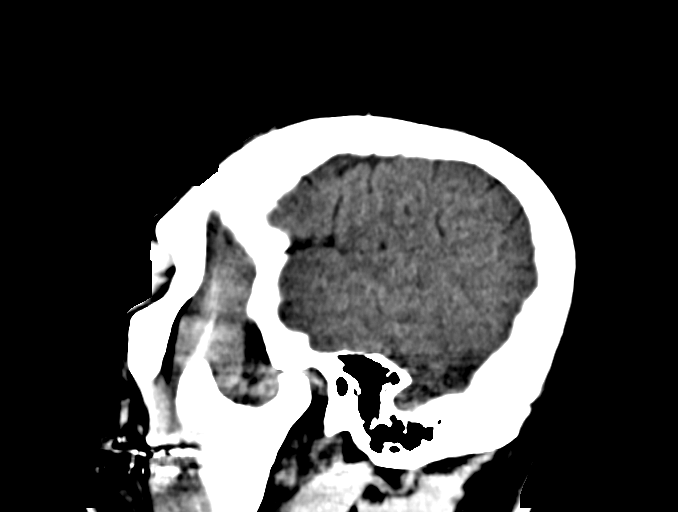
[im 41/82  brain]
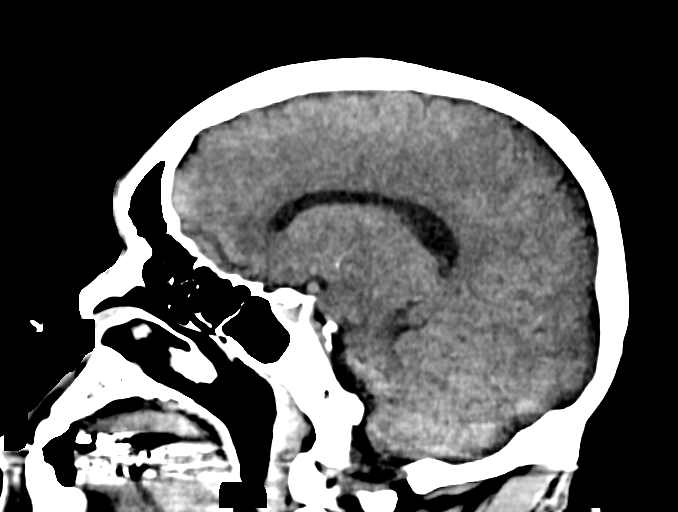
[im 55/82  brain]
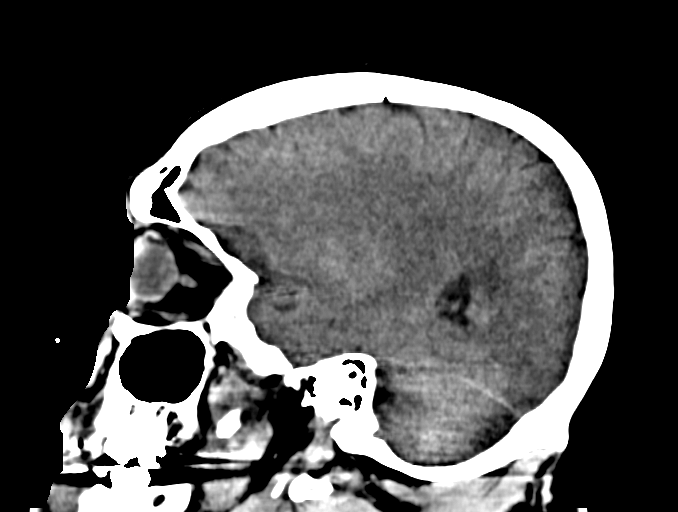

[Series 6: head 3.00 hr40 s3 cor · coronal · 0.36mm/px · 3 of 82 slices shown]
[im 28/82  brain]
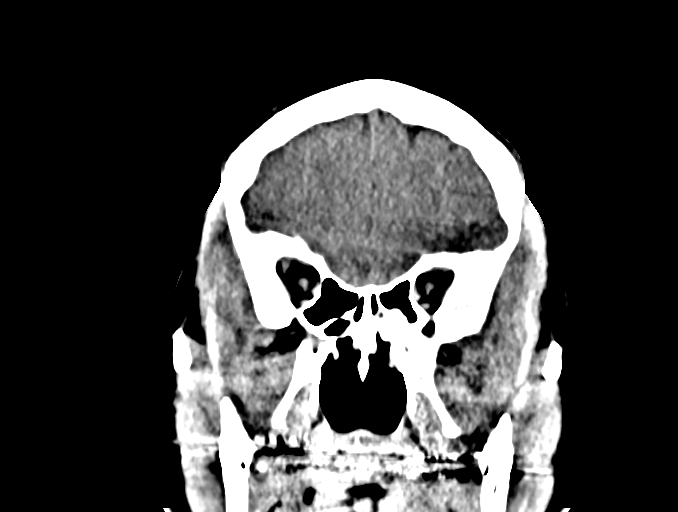
[im 37/82  brain]
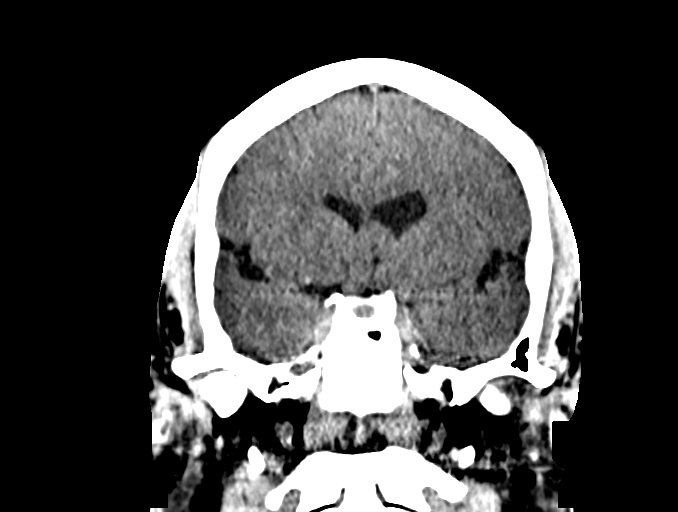
[im 46/82  brain]
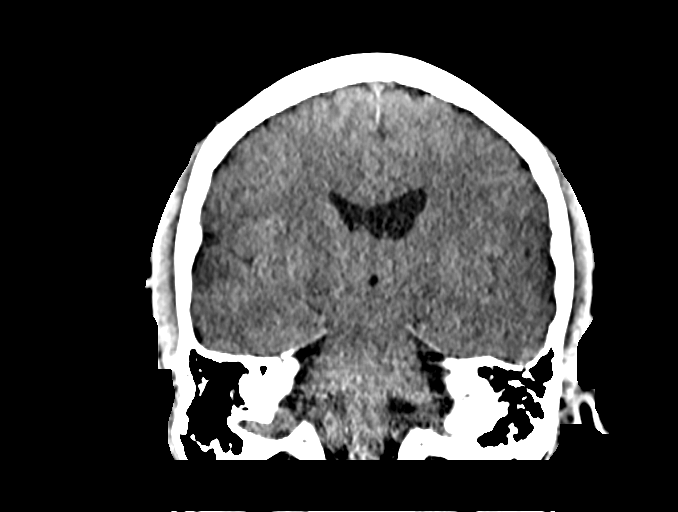

[16 of 47 positions shown; findings below may reference images not displayed]

FINDINGS: Brain: No acute intracranial abnormality. Specifically, no
hemorrhage, hydrocephalus, mass lesion, acute infarction, or
significant intracranial injury.

Vascular: No hyperdense vessel or unexpected calcification.

Skull: No acute calvarial abnormality.

Sinuses/Orbits: Visualized paranasal sinuses and mastoids clear.
Orbital soft tissues unremarkable.

Other: None
IMPRESSION: Normal study.

## 2023-03-18 ENCOUNTER — Ambulatory Visit: Payer: 59 | Admitting: Urology

## 2023-04-29 ENCOUNTER — Ambulatory Visit: Payer: 59 | Admitting: Urology

## 2023-04-29 VITALS — BP 145/80 | HR 60

## 2023-04-29 DIAGNOSIS — R351 Nocturia: Secondary | ICD-10-CM | POA: Diagnosis not present

## 2023-04-29 DIAGNOSIS — R3129 Other microscopic hematuria: Secondary | ICD-10-CM

## 2023-04-29 DIAGNOSIS — N401 Enlarged prostate with lower urinary tract symptoms: Secondary | ICD-10-CM | POA: Diagnosis not present

## 2023-04-29 LAB — URINALYSIS, ROUTINE W REFLEX MICROSCOPIC
Bilirubin, UA: NEGATIVE
Glucose, UA: NEGATIVE
Ketones, UA: NEGATIVE
Leukocytes,UA: NEGATIVE
Nitrite, UA: NEGATIVE
Protein,UA: NEGATIVE
Specific Gravity, UA: 1.01 (ref 1.005–1.030)
Urobilinogen, Ur: 0.2 mg/dL (ref 0.2–1.0)
pH, UA: 6.5 (ref 5.0–7.5)

## 2023-04-29 LAB — MICROSCOPIC EXAMINATION: WBC, UA: NONE SEEN /HPF (ref 0–5)

## 2023-04-29 NOTE — Progress Notes (Unsigned)
 Subjective: 1. Benign localized prostatic hyperplasia with lower urinary tract symptoms (LUTS)   2. Nocturia   3. Microhematuria     Consult requested by  Dr. Renaye Rakers. 04/29/23: Ryan Munoz is a former patient of [facility] who I did a Urolift on in 3/20.  There was a referral attempt made in 3/22 for microhematuria with trace blood on 2 UA's.  He continues to void well with an IPSS of 3. And nocturia x 2.  He has had no hematuria.  He has a prior history of ED but is doing well now.  His UA has trace blood today.   12/02/18: Ryan Munoz returns today in f/u from a Urolift done on 04/07/18. He is doing well without hematuria or dysuria. He is getting better and his stream has improved. His IPSS is down to 4 from l16 at his last visit. He is off of all medication.   GU Hx: Ryan Munoz is a 63 yo male who I last saw in 2012 with BPH and LUTS. He returns now to be assess for a possible Urolift procedure. He had cystoscopy in 2012 and had a 3cm prostate with trilobar hyperplasia with a small middle lobe. His PF was 43ml/sec. He remains on tamsulosin. His IPSS today is 13. He drinks a lot of water and has been working out. He has lost weight. He noted some urgency after a leg work out. He has had no hematuria or dysuria. He reports his PSA has remained low.  ROS:  Review of Systems  Cardiovascular:  Positive for leg swelling (minor).  Musculoskeletal:  Positive for back pain and joint pain.    No Known Allergies  Past Medical History:  Diagnosis Date   Arthritis    degenerative disc disease   Enlarged prostate    Hypertension    Sleep apnea     Past Surgical History:  Procedure Laterality Date   APPENDECTOMY     BACK SURGERY     CYSTOSCOPY WITH INSERTION OF UROLIFT N/A 04/07/2018   Procedure: CYSTOSCOPY WITH INSERTION OF UROLIFT;  Surgeon: Marcine Matar, MD;  Location: [facility];  Service: Urology;  Laterality: N/A;    Social History   Socioeconomic History   Marital status: Married    Spouse name:  Not on file   Number of children: Not on file   Years of education: Not on file   Highest education level: Not on file  Occupational History   Not on file  Tobacco Use   Smoking status: Never   Smokeless tobacco: Never  Vaping Use   Vaping status: Never Used  Substance and Sexual Activity   Alcohol use: No   Drug use: No   Sexual activity: Yes    Birth control/protection: None  Other Topics Concern   Not on file  Social History Narrative   Not on file   Social Drivers of Health   Financial Resource Strain: Not on file  Food Insecurity: Not on file  Transportation Needs: Not on file  Physical Activity: Not on file  Stress: Not on file  Social Connections: Not on file  Intimate Partner Violence: Not on file    Family History  Problem Relation Age of Onset   Cancer Mother    Cancer Sister    Diabetes Father    Hypertension Father     Anti-infectives: Anti-infectives (From admission, onward)    None       Current Outpatient Medications  Medication Sig Dispense Refill   amLODipine (NORVASC)  10 MG tablet Take 1 tablet (10 mg total) by mouth daily. 90 tablet 0   irbesartan (AVAPRO) 300 MG tablet Take 1 tablet (300 mg total) by mouth daily. 90 tablet 0   sildenafil (REVATIO) 20 MG tablet 1-5 tabs po one hour prior to activity (Patient not taking: Reported on 04/29/2023) 30 tablet 11   No current facility-administered medications for this visit.     Objective: Vital signs in last 24 hours: BP (!) 145/80   Pulse 60   Intake/Output from previous day: No intake/output data recorded. Intake/Output this shift: @IOTHISSHIFT @   Physical Exam Vitals reviewed.  Constitutional:      Appearance: Normal appearance.  Genitourinary:    Comments: AP/NST without mass or lesions. Prostate is 2+ benign. SV's non-palpable. Neurological:     Mental Status: He is alert.     Lab Results:  Results for orders placed or performed in visit on 04/29/23 (from the past 24  hours)  Urinalysis, Routine w reflex microscopic     Status: Abnormal   Collection Time: 04/29/23  9:18 AM  Result Value Ref Range   Specific Gravity, UA 1.010 1.005 - 1.030   pH, UA 6.5 5.0 - 7.5   Color, UA Yellow Yellow   Appearance Ur Clear Clear   Leukocytes,UA Negative Negative   Protein,UA Negative Negative/Trace   Glucose, UA Negative Negative   Ketones, UA Negative Negative   RBC, UA Trace (A) Negative   Bilirubin, UA Negative Negative   Urobilinogen, Ur 0.2 0.2 - 1.0 mg/dL   Nitrite, UA Negative Negative   Microscopic Examination See below:    Narrative   Performed at:  [address] - [facility] [address], [place], [place]  [id number] Lab Director: Chinita Pester MT, Phone:  [phone]  Microscopic Examination     Status: None   Collection Time: 04/29/23  9:18 AM   Urine  Result Value Ref Range   WBC, UA None seen 0 - 5 /hpf   RBC, Urine 0-2 0 - 2 /hpf   Epithelial Cells (non renal) 0-10 0 - 10 /hpf   Narrative   Performed at:  [address] - [facility] [address] [place], [place]  [id number] Lab Director: Chinita Pester MT, Phone:  [phone]    BMET No results for input(s): "NA", "K", "CL", "CO2", "GLUCOSE", "BUN", "CREATININE", "CALCIUM" in the last 72 hours. PT/INR No results for input(s): "LABPROT", "INR" in the last 72 hours. ABG No results for input(s): "PHART", "HCO3" in the last 72 hours.  Invalid input(s): "PCO2", "PO2"  Studies/Results:   Assessment/Plan: BPH with BOO: He continues to do well s/p Urolift.  I have requested his PSA results from Dr. Parke Simmers.  Microhematuria:  He has trace blood on UA's with 0-2 RBC's.  He doesn't need a w/u.   No orders of the defined types were placed in this encounter.    Orders Placed This Encounter  Procedures   Microscopic Examination   Urinalysis, Routine w reflex microscopic     Return if symptoms worsen or fail to improve.    CC: Dr. Renaye Rakers.      [name] 04/30/2023

## 2023-09-25 ENCOUNTER — Other Ambulatory Visit: Payer: Self-pay | Admitting: Medical Genetics

## 2023-10-07 ENCOUNTER — Other Ambulatory Visit

## 2023-10-07 DIAGNOSIS — Z006 Encounter for examination for normal comparison and control in clinical research program: Secondary | ICD-10-CM

## 2023-10-22 LAB — GENECONNECT MOLECULAR SCREEN: Genetic Analysis Overall Interpretation: NEGATIVE
